# Patient Record
Sex: Female | Born: 1960 | Race: Black or African American | Hispanic: No | Smoking: Current every day smoker
Health system: Southern US, Community
[De-identification: ages and names within clinical notes are randomized; demographics above are authoritative.]

## PROBLEM LIST (undated history)

## (undated) ENCOUNTER — Emergency Department (HOSPITAL_BASED_OUTPATIENT_CLINIC_OR_DEPARTMENT_OTHER): Discharge status: Absent without leave | Payer: No Typology Code available for payment source

## (undated) DIAGNOSIS — I10 Essential (primary) hypertension: Secondary | ICD-10-CM

## (undated) DIAGNOSIS — K5900 Constipation, unspecified: Secondary | ICD-10-CM

## (undated) HISTORY — PX: HERNIA REPAIR: SHX51

---

## 2005-08-13 ENCOUNTER — Observation Stay (HOSPITAL_COMMUNITY): Admission: EM | Admit: 2005-08-13 | Discharge: 2005-08-14 | Payer: Self-pay | Admitting: Emergency Medicine

## 2008-04-23 ENCOUNTER — Emergency Department (HOSPITAL_COMMUNITY): Admission: EM | Admit: 2008-04-23 | Discharge: 2008-04-23 | Payer: Self-pay | Admitting: Emergency Medicine

## 2008-07-08 ENCOUNTER — Ambulatory Visit (HOSPITAL_COMMUNITY): Admission: RE | Admit: 2008-07-08 | Discharge: 2008-07-08 | Payer: Self-pay | Admitting: Family Medicine

## 2010-07-25 LAB — URINALYSIS, ROUTINE W REFLEX MICROSCOPIC
Bilirubin Urine: NEGATIVE
Glucose, UA: NEGATIVE mg/dL
Leukocytes, UA: NEGATIVE
Nitrite: NEGATIVE
Protein, ur: NEGATIVE mg/dL
Specific Gravity, Urine: >1.03 — ABNORMAL HIGH (ref 1.005–1.030)
Urobilinogen, UA: 0.2 mg/dL (ref 0.0–1.0)
pH: 5.5 (ref 5.0–8.0)

## 2010-07-25 LAB — URINE MICROSCOPIC-ADD ON

## 2010-07-25 LAB — PREGNANCY, URINE: Preg Test, Ur: NEGATIVE

## 2010-08-26 NOTE — H&P (Signed)
[name], [name]                ACCOUNT NO.:  [id number]   MEDICAL RECORD NO.:  [id number]          PATIENT TYPE:  INP   LOCATION:  A203                          FACILITY:  [facility]   PHYSICIAN:  Osvaldo Shipper, MD     DATE OF BIRTH:  [birth year]   DATE OF ADMISSION:  08/13/2005  DATE OF DISCHARGE:  LH                                HISTORY & PHYSICAL   The patient does not have a primary medical doctor.  She lives in  [place].   ADMITTING DIAGNOSES:  1.  Altered mental status.  2.  Polysubstance abuse including opiates and cocaine.  3.  Anemia.   CHIEF COMPLAINT:  Altered mental status.   HISTORY OF PRESENT ILLNESS:  The patient is a 50 year old [place] American  female who does not have any medical problems, who was visiting her mother  from [place] when her mother, this morning, noticed that she was acting  strangely.  She was not responding to her appropriately, although she did  not witness any seizure episode.  She did not have any syncopal episode as  per her mother, but she did slip down onto the floor.  At that point, mother  called EMS and she was brought into the hospital.  The patient is denying  any kind of pain at this time. She mentioned that she took cocaine and  hydrocodone on Friday night.  She did have some alcoholic drinks on Friday  night, but she denied doing anything yesterday.  She does not know what  happened this morning.   She denies any other complaints at this time.   MEDICATIONS:  None.   ALLERGIES:  NO KNOWN DRUG ALLERGIES.   PAST MEDICAL HISTORY:  Again, she has some dental problems and she was  diagnosed with urine infection a few weeks ago, and she was diagnosed with a  fungal infection a few weeks ago, but apart from that, no other history is  available.   SOCIAL HISTORY:  Lives in [place].  Does drugs.  Smokes cigarettes.  Alcohol occasionally.   FAMILY HISTORY:  Unknown.   REVIEW OF SYSTEMS:  Unable to do because of altered  mental status.   PHYSICAL EXAMINATION:  VITAL SIGNS:  Temperature 98.7.  Heart rate is 64.  Blood pressure is 134/79.  Saturation is 100% on room air.  GENERAL EXAM:  This is a well-developed and well-nourished individual in no  apparent distress.  HEENT:  There is no head or neck disorder.  Mucous membranes are moist.  LUNGS:  Clear to auscultation bilaterally.  CARDIOVASCULAR:  S1 and S2 normal.  Regular.  No murmurs appreciated.  ABDOMEN:  Soft, nontender, nondistended.  Bowel sounds are present.  No  masses or organomegaly appreciated.  EXTREMITIES:  Without edema.  NEURO:  Pupils are equal, reactive.  No focal deficits appreciated.   LABORATORY DATA:  White count is 5.3, hemoglobin is 9.8, MCV is 83.  Platelet count is 351.  BMP is  unremarkable.  Amylase is 69.  Acetaminophen  level is less than 10, Salicylate less than 4.  Urine tox  screen was  positive for cocaine and opiates.  Alcohol is less than 5.   No imaging studies have been done.   IMPRESSION:  This is a 50 year old African American female with no real past  medical history except polysubstance abuse, who presents with altered mental  status.  All of this is likely secondary to her polysubstance abuse.  I do  not suspect any kind of intracranial process that is causing these episodes,  although seizure disorder should be on the differential.  The patient was  initially evaluated by [facility], however, she was too drowsy to be sent over to  any behavioral health center at this time.   PLAN:  1.  Altered mental status is likely secondary to polysubstance abuse.  We      will observe the patient in the hospital for overnight observation and      then have [facility] re-evaluate her tomorrow morning.  We will obtain a CT of      the head, non-contrast.  We will check urine pregnancy test, UA with      micro, and check LFTs, do iron studies for her anemia.  Repeat CBC in      the morning to make sure that she does not have any  further drop in her      hemoglobin.   Further management decisions will be based on the initial testing and the  patient's response to treatment.      Osvaldo Shipper, MD  Electronically Signed     GK/MEDQ  D:  08/13/2005  T:  08/13/2005  Job:  [id number]

## 2010-08-26 NOTE — Discharge Summary (Signed)
[name], [name]                ACCOUNT NO.:  [id number]   MEDICAL RECORD NO.:  [id number]          PATIENT TYPE:  INP   LOCATION:  A203                          FACILITY:  [facility]   PHYSICIAN:  Renato Battles, M.D.     DATE OF BIRTH:  1960/07/10   DATE OF ADMISSION:  08/13/2005  DATE OF DISCHARGE:  05/07/2007LH                                 DISCHARGE SUMMARY   PRIMARY CARE PHYSICIAN:  Unassigned.   DISCHARGE DIAGNOSES:  1.  Altered mental status secondary to overdose.  2.  Multi-substance abuse.  3.  Anemia, most likely chronic disease.   DISCHARGE MEDICATIONS:  None.   CONSULTATIONS:  [facility] Team.   PROCEDURES:  None.   HISTORY OF PRESENT ILLNESS/HOSPITAL COURSE:  The patient is a 50 year old  African American female who was found by her friends to be unresponsive.  Was brought to the emergency department for evaluation.  She was noted to be  very drowsy and very difficult to arouse.  It was very difficult to obtain  any history from her.  When she woke up, she told me that she did some  cocaine.  She took Oxycodone that was given to her by a dentist after  treatment for dental abscess.  When she ran out, she took some narcotics  from her neighbor, but she did not know what it was and mixed it with  cocaine.  She expressed desire to quit using cocaine and I refered her to  chemical dependence team.   PHYSICAL EXAMINATION:  Not significant for any abnormal findings except from  assessment on presentation.  She as very stable for discharge.   DISCHARGE INSTRUCTIONS:  1.  Diet:  Heart healthy.  2.  Activity:  As tolerated.   DISPOSITION:  Home with polysubstance abuse assistance referral.     Renato Battles, M.D.  Electronically Signed    SA/MEDQ  D:  08/14/2005  T:  08/15/2005  Job:  [id number]

## 2011-02-22 ENCOUNTER — Encounter: Payer: Self-pay | Admitting: *Deleted

## 2011-02-22 ENCOUNTER — Emergency Department (HOSPITAL_COMMUNITY)
Admission: EM | Admit: 2011-02-22 | Discharge: 2011-02-22 | Disposition: A | Discharge status: Home / Self Care | Payer: No Typology Code available for payment source | Attending: Emergency Medicine | Admitting: Emergency Medicine

## 2011-02-22 ENCOUNTER — Emergency Department (HOSPITAL_COMMUNITY): Payer: No Typology Code available for payment source

## 2011-02-22 DIAGNOSIS — S139XXA Sprain of joints and ligaments of unspecified parts of neck, initial encounter: Secondary | ICD-10-CM | POA: Insufficient documentation

## 2011-02-22 DIAGNOSIS — S161XXA Strain of muscle, fascia and tendon at neck level, initial encounter: Secondary | ICD-10-CM

## 2011-02-22 DIAGNOSIS — I10 Essential (primary) hypertension: Secondary | ICD-10-CM | POA: Insufficient documentation

## 2011-02-22 HISTORY — DX: Essential (primary) hypertension: I10

## 2011-02-22 MED ORDER — CYCLOBENZAPRINE HCL 5 MG PO TABS: 5.0000 mg | ORAL_TABLET | Freq: Three times a day (TID) | ORAL | Status: AC | PRN
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
## 2011-02-24 NOTE — ED Provider Notes (Signed)
Medical screening examination/treatment/procedure(s) were performed by non-physician practitioner and as supervising physician I was immediately available for consultation/collaboration.   Dougles Kimmey L Baylon Santelli, MD 02/24/11 1512 

## 2017-06-03 ENCOUNTER — Emergency Department (HOSPITAL_COMMUNITY): Payer: Worker's Compensation

## 2017-06-03 ENCOUNTER — Other Ambulatory Visit: Payer: Self-pay

## 2017-06-03 ENCOUNTER — Encounter (HOSPITAL_COMMUNITY): Payer: Self-pay | Admitting: Emergency Medicine

## 2017-06-03 ENCOUNTER — Emergency Department (HOSPITAL_COMMUNITY)
Admission: EM | Admit: 2017-06-03 | Discharge: 2017-06-03 | Disposition: A | Discharge status: Home / Self Care | Payer: Worker's Compensation | Attending: Emergency Medicine | Admitting: Emergency Medicine

## 2017-06-03 DIAGNOSIS — Y990 Civilian activity done for income or pay: Secondary | ICD-10-CM | POA: Diagnosis not present

## 2017-06-03 DIAGNOSIS — Y929 Unspecified place or not applicable: Secondary | ICD-10-CM | POA: Diagnosis not present

## 2017-06-03 DIAGNOSIS — S8992XA Unspecified injury of left lower leg, initial encounter: Secondary | ICD-10-CM

## 2017-06-03 DIAGNOSIS — F17210 Nicotine dependence, cigarettes, uncomplicated: Secondary | ICD-10-CM | POA: Insufficient documentation

## 2017-06-03 DIAGNOSIS — I10 Essential (primary) hypertension: Secondary | ICD-10-CM | POA: Diagnosis not present

## 2017-06-03 DIAGNOSIS — W228XXA Striking against or struck by other objects, initial encounter: Secondary | ICD-10-CM | POA: Insufficient documentation

## 2017-06-03 DIAGNOSIS — Y9389 Activity, other specified: Secondary | ICD-10-CM | POA: Insufficient documentation

## 2017-06-03 DIAGNOSIS — Z79899 Other long term (current) drug therapy: Secondary | ICD-10-CM | POA: Insufficient documentation

## 2017-06-03 MED ORDER — MELOXICAM 15 MG PO TABS: 15.0000 mg | ORAL_TABLET | Freq: Every day | ORAL | 0 refills | Status: DC

## 2017-06-03 MED ORDER — OXYCODONE-ACETAMINOPHEN 5-325 MG PO TABS: 1.0000 | ORAL_TABLET | Freq: Four times a day (QID) | ORAL | 0 refills | Status: DC | PRN

## 2017-06-03 MED ORDER — OXYCODONE-ACETAMINOPHEN 5-325 MG PO TABS
1.0000 | ORAL_TABLET | Freq: Once | ORAL | Status: AC
  Administered 2017-06-03: 1 via ORAL
  Filled 2017-06-03: qty 1

## 2017-06-03 NOTE — Discharge Instructions (Addendum)
Your x-ray today was negative for a broken bone/fracture.   Please wear the knee sleeve to provide compression to the joint to help with pain.  Use crutches until your pain improves so that you do not have to put weight on your left foot.  When you are sitting and resting at home, probably your leg above the level of your heart to help with pain and swelling.  Apply ice as frequently as needed to help with your pain and swelling.  For mild to moderate pain, you can take 1 tablet of meloxicam daily with food.  Please do not take ibuprofen, Advil, Aleve, or naproxen while taking this medication as they are very similar medicines.  You can also take 650 mg of Tylenol every 6 hours if the meloxicam is unable to control your pain.  For severe pain, you may take 1 tablet of Percocet every 6 hours.  This medication is a narcotic and should only be used for uncontrollable pain.  Please try to stop taking this medication as soon as possible because it can be addicting.  Please do not drive or work while taking this medication because it can impair your judgment and decision-making.   If your pain does not start to improve with these activities within the next week, you can follow-up with your primary nurse practitioner or call and schedule a follow-up appointment with Dr. Aundria Rudogers, who is an orthopedist/bone doctor.  If you develop new or worsening symptoms, including numbness or weakness in the left lower leg, if the knee becomes red or hot to the touch, or if you have a new fall or injury, or other concerning symptoms, please return to the emergency department for reevaluation.

## 2017-06-03 NOTE — ED Notes (Signed)
Pt returned from xray
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
## 2017-12-20 ENCOUNTER — Ambulatory Visit (HOSPITAL_COMMUNITY)
Admission: RE | Admit: 2017-12-20 | Discharge: 2017-12-20 | Disposition: A | Discharge status: Home / Self Care | Payer: Worker's Compensation | Source: Ambulatory Visit | Attending: Orthopedic Surgery | Admitting: Orthopedic Surgery

## 2017-12-20 ENCOUNTER — Encounter (HOSPITAL_COMMUNITY): Payer: Self-pay | Admitting: Emergency Medicine

## 2017-12-20 ENCOUNTER — Other Ambulatory Visit (HOSPITAL_COMMUNITY): Payer: Self-pay | Admitting: Orthopedic Surgery

## 2017-12-20 ENCOUNTER — Emergency Department (HOSPITAL_COMMUNITY)
Admission: EM | Admit: 2017-12-20 | Discharge: 2017-12-20 | Disposition: A | Discharge status: Home / Self Care | Payer: Worker's Compensation | Attending: Emergency Medicine | Admitting: Emergency Medicine

## 2017-12-20 DIAGNOSIS — R52 Pain, unspecified: Secondary | ICD-10-CM

## 2017-12-20 DIAGNOSIS — M7989 Other specified soft tissue disorders: Secondary | ICD-10-CM | POA: Insufficient documentation

## 2017-12-20 DIAGNOSIS — M79605 Pain in left leg: Secondary | ICD-10-CM | POA: Diagnosis present

## 2017-12-20 DIAGNOSIS — Z5321 Procedure and treatment not carried out due to patient leaving prior to being seen by health care provider: Secondary | ICD-10-CM | POA: Insufficient documentation

## 2017-12-20 DIAGNOSIS — M79662 Pain in left lower leg: Secondary | ICD-10-CM | POA: Insufficient documentation

## 2017-12-20 NOTE — ED Triage Notes (Signed)
Pt co left lower leg swelling and pain x 5 days. Reports that she was at her pre-op appt today and was told to go to ED to rule out PE.

## 2017-12-20 NOTE — Progress Notes (Signed)
Left lower extremity venous duplex has been completed. Negative for DVT. An area of mixed echogenicity extending from the medial, proximal calf to the mid calf of the left lower extremity is noted. [facility]. Luiz BlareGraves' office.  12/20/17 2:03 PM Olen CordialGreg Seara Hinesley RVT

## 2018-11-01 ENCOUNTER — Other Ambulatory Visit: Payer: Self-pay

## 2018-11-01 DIAGNOSIS — Z20822 Contact with and (suspected) exposure to covid-19: Secondary | ICD-10-CM

## 2018-11-04 LAB — NOVEL CORONAVIRUS, NAA: SARS-CoV-2, NAA: NOT DETECTED

## 2020-07-11 ENCOUNTER — Encounter (HOSPITAL_COMMUNITY): Payer: Self-pay

## 2020-07-11 ENCOUNTER — Emergency Department (HOSPITAL_COMMUNITY): Payer: No Typology Code available for payment source

## 2020-07-11 ENCOUNTER — Other Ambulatory Visit: Payer: Self-pay

## 2020-07-11 ENCOUNTER — Emergency Department (HOSPITAL_COMMUNITY)
Admission: EM | Admit: 2020-07-11 | Discharge: 2020-07-11 | Disposition: A | Discharge status: Home / Self Care | Payer: No Typology Code available for payment source | Attending: Emergency Medicine | Admitting: Emergency Medicine

## 2020-07-11 DIAGNOSIS — R1310 Dysphagia, unspecified: Secondary | ICD-10-CM | POA: Diagnosis not present

## 2020-07-11 DIAGNOSIS — J029 Acute pharyngitis, unspecified: Secondary | ICD-10-CM | POA: Diagnosis present

## 2020-07-11 DIAGNOSIS — I10 Essential (primary) hypertension: Secondary | ICD-10-CM | POA: Insufficient documentation

## 2020-07-11 DIAGNOSIS — F17210 Nicotine dependence, cigarettes, uncomplicated: Secondary | ICD-10-CM | POA: Diagnosis not present

## 2020-07-11 DIAGNOSIS — Z720 Tobacco use: Secondary | ICD-10-CM

## 2020-07-11 LAB — BASIC METABOLIC PANEL
Anion gap: 8 (ref 5–15)
BUN: 15 mg/dL (ref 8–23)
CO2: 31 mmol/L (ref 22–32)
Calcium: 9.5 mg/dL (ref 8.9–10.3)
Chloride: 102 mmol/L (ref 98–111)
Creatinine, Ser: 0.62 mg/dL (ref 0.44–1.00)
GFR, Estimated: >60 mL/min (ref >60)
Glucose, Bld: 106 mg/dL — ABNORMAL HIGH (ref 70–99)
Potassium: 3.8 mmol/L (ref 3.5–5.1)
Sodium: 141 mmol/L (ref 135–145)

## 2020-07-11 LAB — CBC
HCT: 38.8 % (ref 36.0–46.0)
Hemoglobin: 12.2 g/dL (ref 12.0–15.0)
MCH: 31.2 pg (ref 26.0–34.0)
MCHC: 31.4 g/dL (ref 30.0–36.0)
MCV: 99.2 fL (ref 80.0–100.0)
Platelets: 264 10*3/uL (ref 150–400)
RBC: 3.91 MIL/uL (ref 3.87–5.11)
RDW: 13.8 % (ref 11.5–15.5)
WBC: 7.3 10*3/uL (ref 4.0–10.5)
nRBC: 0 % (ref 0.0–0.2)

## 2020-07-11 LAB — GROUP A STREP BY PCR: Group A Strep by PCR: NOT DETECTED

## 2020-07-11 MED ORDER — AMOXICILLIN 250 MG/5ML PO SUSR
500.0000 mg | Freq: Once | ORAL | Status: AC
  Administered 2020-07-11: 500 mg via ORAL
  Filled 2020-07-11: qty 10

## 2020-07-11 MED ORDER — LISINOPRIL 10 MG PO TABS
20.0000 mg | ORAL_TABLET | ORAL | Status: AC
  Administered 2020-07-11: 20 mg via ORAL
  Filled 2020-07-11: qty 2

## 2020-07-11 MED ORDER — LIDOCAINE VISCOUS HCL 2 % MT SOLN
15.0000 mL | Freq: Once | OROMUCOSAL | Status: AC
  Administered 2020-07-11: 15 mL via OROMUCOSAL
  Filled 2020-07-11: qty 15

## 2020-07-11 MED ORDER — DEXAMETHASONE SODIUM PHOSPHATE 10 MG/ML IJ SOLN
10.0000 mg | Freq: Once | INTRAMUSCULAR | Status: AC
  Administered 2020-07-11: 10 mg via INTRAVENOUS
  Filled 2020-07-11: qty 1

## 2020-07-11 MED ORDER — AMOXICILLIN 500 MG PO CAPS: 500.0000 mg | ORAL_CAPSULE | Freq: Three times a day (TID) | ORAL | 0 refills | Status: DC

## 2020-07-11 MED ORDER — IOHEXOL 300 MG/ML  SOLN
75.0000 mL | Freq: Once | INTRAMUSCULAR | Status: AC | PRN
  Administered 2020-07-11: 75 mL via INTRAVENOUS

## 2020-07-11 NOTE — ED Notes (Signed)
Dr Hyacinth Meeker aware of HRs in 40s.

## 2020-07-11 NOTE — Discharge Instructions (Addendum)
Your testing shows some inflammation of your throat -   You should see your doctor in 2 days for recheck  Amoxicillin 3 times daily for 10 days  ER for worsening symtpoms  Lots of fluids  Tylenol or ibuprofen for pain  [facility] Doctor List    Kari Baars MD. Specialty: Pulmonary Disease Contact information: [address]  [address]  [place] [address]  [phone]   Syliva Overman, MD. Specialty: [facility] Contact information: [address]  [place] [address]  [phone]   Lilyan Punt, MD. Specialty: Family Medicine Contact information: [address]  [place] [address]  [phone]   Avon Gully, MD Specialty: Internal Medicine Contact information: [address] [place] [address]  [phone]   Catalina Pizza, MD. Specialty: Internal Medicine Contact information: [address]  [place] [address]  [phone]    [facility] (Dr. Selena Batten) Specialty: Family Medicine Contact information: [address]  [place] [address]  [phone]   John Giovanni, MD. Specialty: [facility] Contact information: [address]  [address]  [place] [address]  [phone]   Carylon Perches, MD. Specialty: Internal Medicine Contact information: [address]  [address]  [place] [address]  [phone]    [facility]  [address] [place], [place] [address] [phone]  Services [facility] offers a variety of basic health services.  Services include but are not limited to: Blood pressure checks  Heart rate checks  Blood sugar checks  Urine analysis  Rapid strep tests  Pregnancy tests.  Health education and referrals  People needing more complex services will be directed to a physician online. Using these virtual visits, doctors can evaluate and prescribe medicine and  treatments. There will be no medication on-site, though [facility] will help patients fill their prescriptions at little to no cost.   For More information please go to: [email or web address]

## 2020-07-11 NOTE — Clinical Social Work Note (Signed)
[facility] notification done [phone]

## 2020-07-11 NOTE — ED Provider Notes (Signed)
[facility] EMERGENCY DEPARTMENT Provider Note   CSN: [id number] Arrival date & time: 07/11/20  [id number]     History Chief Complaint  Patient presents with  . Sore Throat    [name] is a 60 y.o. female.  HPI   This patient is a 60 year old female with a history of hypertension, she also reports a very heavy tobacco use which she has been doing for a very long time.  She reports over the last week that she has had a sore throat which is her chief complaint, this is been persistent, it does not get better with any throat lozenges, she states it is hard to swallow in fact she feels like she is going to aspirate when she does try to swallow so she has not taken her blood pressure medication in at least 1 week but even prior to that she endorses not being compliant with her medicines.  She does not have any fevers or chills, she has had a bit of a cough, this is an unusual cough for her.  She states that she has been to the [facility] in the past because of difficulty with swallowing and sore throats and they have not been able to diagnose her with anything.  She is unsure exactly what test they did but when I talk to her about a possible scope by ENT she describes that they did run a camera down her throat but did not find anything.  Her symptoms have worsened this week, she has not had nausea or vomiting, she has not had diarrhea, she has no abdominal pain, no rash, no swelling, no headache.  No changes in vision.  No improvement with throat lozenges.  No sick contacts.  No recent travel.  Past Medical History:  Diagnosis Date  . Hypertension     There are no problems to display for this patient.   Past Surgical History:  Procedure Laterality Date  . HERNIA REPAIR       OB History   No obstetric history on file.     No family history on file.  Social History   Tobacco Use  . Smoking status: Current Every Day Smoker    Packs/day: 1.00    Types: Cigarettes  .  Smokeless tobacco: Never Used  Vaping Use  . Vaping Use: Never used  Substance Use Topics  . Alcohol use: No  . Drug use: Not Currently    Home Medications Prior to Admission medications   Medication Sig Start Date End Date Taking? Authorizing Provider  amoxicillin (AMOXIL) 500 MG capsule Take 1 capsule (500 mg total) by mouth 3 (three) times daily. 07/11/20  Yes Eber HongMiller, Ebonique Hallstrom, MD  docusate sodium (COLACE) 100 MG capsule Take 100 mg by mouth 2 (two) times daily.   Yes [provider]  LISINOPRIL PO Take 1 tablet by mouth daily.   Yes [provider]    Allergies    Patient has no known allergies.  Review of Systems   Review of Systems  All other systems reviewed and are negative.   Physical Exam Updated Vital Signs BP (!) 150/63   Pulse (!) 45   Temp 99.1 F (37.3 C) (Oral)   Resp 14   Ht 1.651 m (5\' 5" )   Wt 74.8 kg   SpO2 92%   BMI 27.46 kg/m   Physical Exam Vitals and nursing note reviewed.  Constitutional:      General: She is not in acute distress.  Appearance: She is well-developed.  HENT:     Head: Normocephalic and atraumatic.     Comments: The patient has a very gravelly voice    Nose: No congestion or rhinorrhea.     Mouth/Throat:     Pharynx: No oropharyngeal exudate.     Comments: The oropharynx is clear and moist, there is no signs of uvular deviation or swelling or edema, the tonsillar pillars are well visualized, there is no trismus or torticollis, there is no exudate Eyes:     General: No scleral icterus.       Right eye: No discharge.        Left eye: No discharge.     Conjunctiva/sclera: Conjunctivae normal.     Pupils: Pupils are equal, round, and reactive to light.  Neck:     Thyroid: No thyromegaly.     Vascular: No JVD.     Comments: Neck exam is unremarkable Cardiovascular:     Rate and Rhythm: Normal rate and regular rhythm.     Heart sounds: Normal heart sounds. No murmur heard. No friction rub. No gallop.    Pulmonary:     Effort: Pulmonary effort is normal. No respiratory distress.     Breath sounds: Normal breath sounds. No wheezing or rales.  Abdominal:     General: Bowel sounds are normal. There is no distension.     Palpations: Abdomen is soft. There is no mass.     Tenderness: There is no abdominal tenderness.  Musculoskeletal:        General: No tenderness. Normal range of motion.     Cervical back: Normal range of motion and neck supple.  Lymphadenopathy:     Cervical: No cervical adenopathy.  Skin:    General: Skin is warm and dry.     Findings: No erythema or rash.  Neurological:     Mental Status: She is alert.     Coordination: Coordination normal.  Psychiatric:        Behavior: Behavior normal.     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 Query throat mass. Sore throat x1 week. EXAM: CT NECK WITH CONTRAST TECHNIQUE: Multidetector CT imaging of the neck was performed using the standard protocol following the bolus administration of intravenous contrast. CONTRAST:  21mL OMNIPAQUE IOHEXOL 300 MG/ML  SOLN COMPARISON:  Head CT 08/13/2005. FINDINGS: Pharynx and larynx: The glottis is closed. Above and below the glottis laryngeal soft tissue contours appear within normal limits; mild generalized thickening of the epiglottis suspected. There also seems to be mild generalized pharyngeal mucosal space soft tissue thickening. Mild symmetric lingual tonsil hypertrophy. But no discrete pharyngeal mass or hyperenhancement. The parapharyngeal and  retropharyngeal spaces appear negative. Oral tongue appears symmetric. Salivary glands: Negative sublingual space. Symmetric appearing submandibular gland ductal ectasia. No sialolithiasis or acute gland inflammation. Bilateral parotid glands appear symmetric and normal. Thyroid: Negative. Lymph nodes: Symmetric appearing bilateral cervical lymph nodes, individually measuring up to 6-7 mm short axis. No suspicious lymph nodes identified. Vascular: Major  vascular structures in the neck and at the skull base appear to be patent. Tortuous right carotid. Bilateral cervical carotid soft and calcified atherosclerosis. No abnormality of either jugular foramen. Limited intracranial: Negative. Visualized orbits: Negative. Mastoids and visualized paranasal sinuses: Small mucous retention cysts in the maxillary sinuses, otherwise well aerated. Visible tympanic cavities and mastoids are clear. Skeleton: Absent dentition. Visible osseous structures appear normal for age. Upper chest: Apical lung scarring and centrilobular emphysema. No upper lung nodule. Minor calcified aortic atherosclerosis. No superior mediastinal lymphadenopathy. Other: None. IMPRESSION: 1. Mild generalized pharyngeal mucosal space soft tissue thickening, such can be due to URI. But negative for discrete pharyngeal or laryngeal mass. Correlation with endoscopic evaluation by ENT would be complementary. 2. 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 3. Emphysema (ICD10-J43.9). Electronically Signed   By: Odessa Fleming M.D.   On: 07/11/2020 11:52   DG Chest Port 1 View  Result Date: 07/11/2020 CLINICAL DATA:  Cough, smoker EXAM: PORTABLE CHEST 1 VIEW COMPARISON:  None. FINDINGS: Normal mediastinum and cardiac silhouette. Chronic central bronchitic markings. Normal pulmonary vasculature. No effusion, infiltrate, or pneumothorax. IMPRESSION: Mild bronchitic markings.  No acute findings. Electronically  Signed   By: Genevive Bi M.D.   On: 07/11/2020 10:14    Procedures Procedures   Medications Ordered in ED Medications  lisinopril (ZESTRIL) tablet 20 mg (20 mg Oral Given 07/11/20 0950)  lidocaine (XYLOCAINE) 2 % viscous mouth solution 15 mL (15 mLs Mouth/Throat Given 07/11/20 0950)  iohexol (OMNIPAQUE) 300 MG/ML solution 75 mL (75 mLs Intravenous Contrast Given 07/11/20 1109)  dexamethasone (DECADRON) injection 10 mg (10 mg Intravenous Given 07/11/20 1222)  amoxicillin (AMOXIL) 250 MG/5ML suspension 500 mg (500 mg Oral Given 07/11/20 1222)    ED Course  I have reviewed the triage vital signs and the nursing notes.  Pertinent labs & imaging results that were available during my care of the patient were reviewed by me and considered in my medical decision making (see chart for details).    MDM Rules/Calculators/A&P                          The patient has not recently had exam, she has a longtime smoking history, and is appropriately concerned for throat cancer.  Given the significant change in her voice and the aspiration that she feels like she is having I will check a CT scan of the neck.  Check for strep, labs, patient agreeable.  She will also be given her antihypertensives.  CT reassuring Labs reassuring VS have improved  Vitals:   07/11/20 1030 07/11/20 1100 07/11/20 1130 07/11/20 1200  BP: (!) 198/93 (!) 170/71 (!) 171/68 (!) 150/63  Pulse: (!) 46 (!) 44 (!) 49 (!) 45  Resp: 11 12 12 14   Temp:      TempSrc:      SpO2: 96% 95% 100% 92%  Weight:      Height:        Given decadron, amox, tolerated well, feels good for /dc No signs of airway compromise  Agreeable to f/u.  Final Clinical Impression(s) / ED Diagnoses Final diagnoses:  Dysphagia, unspecified type  Essential hypertension  Tobacco abuse  Pharyngitis, unspecified etiology    Rx / DC Orders ED Discharge Orders         Ordered    amoxicillin (AMOXIL) 500 MG capsule  3 times daily        07/11/20  1249            Eber Hong, MD 07/11/20 1251

## 2020-07-11 NOTE — ED Notes (Signed)
Dr Miller at bedside for MSE prior to triage complete.  

## 2020-07-11 NOTE — ED Triage Notes (Signed)
Pt presents to ED, states she has had a sore throat x 1 week, difficulty swallowing, red spots on throat. Dr Hyacinth Meeker at bedside.

## 2020-09-18 ENCOUNTER — Emergency Department (HOSPITAL_COMMUNITY)
Admission: EM | Admit: 2020-09-18 | Discharge: 2020-09-18 | Disposition: A | Discharge status: Home / Self Care | Payer: No Typology Code available for payment source | Attending: Emergency Medicine | Admitting: Emergency Medicine

## 2020-09-18 ENCOUNTER — Encounter (HOSPITAL_COMMUNITY): Payer: Self-pay

## 2020-09-18 ENCOUNTER — Other Ambulatory Visit: Payer: Self-pay

## 2020-09-18 DIAGNOSIS — E860 Dehydration: Secondary | ICD-10-CM | POA: Diagnosis not present

## 2020-09-18 DIAGNOSIS — N289 Disorder of kidney and ureter, unspecified: Secondary | ICD-10-CM

## 2020-09-18 DIAGNOSIS — Z79899 Other long term (current) drug therapy: Secondary | ICD-10-CM | POA: Insufficient documentation

## 2020-09-18 DIAGNOSIS — F17210 Nicotine dependence, cigarettes, uncomplicated: Secondary | ICD-10-CM | POA: Diagnosis not present

## 2020-09-18 DIAGNOSIS — I10 Essential (primary) hypertension: Secondary | ICD-10-CM | POA: Insufficient documentation

## 2020-09-18 DIAGNOSIS — R42 Dizziness and giddiness: Secondary | ICD-10-CM | POA: Diagnosis present

## 2020-09-18 LAB — CBC WITH DIFFERENTIAL/PLATELET
Abs Immature Granulocytes: 0.04 10*3/uL (ref 0.00–0.07)
Basophils Absolute: 0 10*3/uL (ref 0.0–0.1)
Basophils Relative: 0 %
Eosinophils Absolute: 0 10*3/uL (ref 0.0–0.5)
Eosinophils Relative: 0 %
HCT: 40.3 % (ref 36.0–46.0)
Hemoglobin: 12.7 g/dL (ref 12.0–15.0)
Immature Granulocytes: 1 %
Lymphocytes Relative: 45 %
Lymphs Abs: 3.6 10*3/uL (ref 0.7–4.0)
MCH: 31.5 pg (ref 26.0–34.0)
MCHC: 31.5 g/dL (ref 30.0–36.0)
MCV: 100 fL (ref 80.0–100.0)
Monocytes Absolute: 0.4 10*3/uL (ref 0.1–1.0)
Monocytes Relative: 5 %
Neutro Abs: 3.9 10*3/uL (ref 1.7–7.7)
Neutrophils Relative %: 49 %
Platelets: 252 10*3/uL (ref 150–400)
RBC: 4.03 MIL/uL (ref 3.87–5.11)
RDW: 12.7 % (ref 11.5–15.5)
WBC: 8 10*3/uL (ref 4.0–10.5)
nRBC: 0 % (ref 0.0–0.2)

## 2020-09-18 LAB — COMPREHENSIVE METABOLIC PANEL
ALT: 11 U/L (ref 0–44)
AST: 14 U/L — ABNORMAL LOW (ref 15–41)
Albumin: 4.3 g/dL (ref 3.5–5.0)
Alkaline Phosphatase: 74 U/L (ref 38–126)
Anion gap: 8 (ref 5–15)
BUN: 32 mg/dL — ABNORMAL HIGH (ref 8–23)
CO2: 22 mmol/L (ref 22–32)
Calcium: 9.2 mg/dL (ref 8.9–10.3)
Chloride: 105 mmol/L (ref 98–111)
Creatinine, Ser: 1.7 mg/dL — ABNORMAL HIGH (ref 0.44–1.00)
GFR, Estimated: 34 mL/min — ABNORMAL LOW (ref >60)
Glucose, Bld: 108 mg/dL — ABNORMAL HIGH (ref 70–99)
Potassium: 4 mmol/L (ref 3.5–5.1)
Sodium: 135 mmol/L (ref 135–145)
Total Bilirubin: 0.4 mg/dL (ref 0.3–1.2)
Total Protein: 7.1 g/dL (ref 6.5–8.1)

## 2020-09-18 LAB — URINALYSIS, ROUTINE W REFLEX MICROSCOPIC
Bilirubin Urine: NEGATIVE
Glucose, UA: NEGATIVE mg/dL
Ketones, ur: NEGATIVE mg/dL
Leukocytes,Ua: NEGATIVE
Nitrite: NEGATIVE
Protein, ur: NEGATIVE mg/dL
Specific Gravity, Urine: 1.018 (ref 1.005–1.030)
pH: 5 (ref 5.0–8.0)

## 2020-09-18 MED ORDER — SODIUM CHLORIDE 0.9 % IV BOLUS
1000.0000 mL | Freq: Once | INTRAVENOUS | Status: AC
  Administered 2020-09-18: 1000 mL via INTRAVENOUS

## 2020-09-18 NOTE — ED Provider Notes (Signed)
[facility] EMERGENCY DEPARTMENT Provider Note   CSN: [id number] Arrival date & time: 09/18/20  1849     History Chief Complaint  Patient presents with   Hypotension    [name] is a 60 y.o. female.  HPI  This patient is a very pleasant 60 year old female, she has a history of hypertension as well as her hernia repair.  She takes lisinopril.  The patient reports to me that 5 days ago she underwent a surgical procedure to remove a polyp on her vocal cords and since that time has been taking lots of clear liquids, she has been taking oxycodone regularly for pain and today has noticed that she has been lightheaded most of the day.  She has had to take laxatives and a enema to have a bowel movement which she last had 4 days ago.  She has not had any difficulty urinating but today was feeling lightheaded, she did not pass out and had no chest pain shortness of breath coughing or fever, she has had no swelling of the legs or rashes, her father took her blood pressure at home and it measured 60 systolic and when they called 322 they told them to come to the hospital.  The patient was brought by paramedics with an IV in place, she states that her symptoms completely resolved before she left the house.  On arrival her blood pressure is 106 systolic.  She has been taking oxycodone regularly for the last 5 days, today she took it on an empty stomach and states that she felt worse after she took  Past Medical History:  Diagnosis Date   Hypertension     There are no problems to display for this patient.   Past Surgical History:  Procedure Laterality Date   HERNIA REPAIR       OB History   No obstetric history on file.     History reviewed. No pertinent family history.  Social History   Tobacco Use   Smoking status: Every Day    Packs/day: 1.00    Pack years: 0.00    Types: Cigarettes   Smokeless tobacco: Never  Vaping Use   Vaping Use: Never used  Substance Use Topics    Alcohol use: No   Drug use: Not Currently    Home Medications Prior to Admission medications   Medication Sig Start Date End Date Taking? Authorizing Provider  amoxicillin (AMOXIL) 500 MG capsule Take 1 capsule (500 mg total) by mouth 3 (three) times daily. 07/11/20   Eber Hong, MD  docusate sodium (COLACE) 100 MG capsule Take 100 mg by mouth 2 (two) times daily.    [provider]  LISINOPRIL PO Take 1 tablet by mouth daily.    [provider]    Allergies    Patient has no known allergies.  Review of Systems   Review of Systems  All other systems reviewed and are negative.  Physical Exam Updated Vital Signs BP 116/65 (BP Location: Left Arm)   Pulse (!) 54   Temp 98 F (36.7 C) (Oral)   Resp 14   Ht 1.651 m (5\' 5" )   Wt 79.8 kg   SpO2 94%   BMI 29.29 kg/m   Physical Exam Vitals and nursing note reviewed.  Constitutional:      General: She is not in acute distress.    Appearance: She is well-developed.  HENT:     Head: Normocephalic and atraumatic.     Mouth/Throat:  Pharynx: No oropharyngeal exudate.  Eyes:     General: No scleral icterus.       Right eye: No discharge.        Left eye: No discharge.     Conjunctiva/sclera: Conjunctivae normal.     Pupils: Pupils are equal, round, and reactive to light.  Neck:     Thyroid: No thyromegaly.     Vascular: No JVD.  Cardiovascular:     Rate and Rhythm: Normal rate and regular rhythm.     Heart sounds: Normal heart sounds. No murmur heard.   No friction rub. No gallop.  Pulmonary:     Effort: Pulmonary effort is normal. No respiratory distress.     Breath sounds: Normal breath sounds. No wheezing or rales.  Abdominal:     General: Bowel sounds are normal. There is no distension.     Palpations: Abdomen is soft. There is no mass.     Tenderness: There is no abdominal tenderness.  Musculoskeletal:        General: No tenderness. Normal range of motion.     Cervical back: Normal range of  motion and neck supple.  Lymphadenopathy:     Cervical: No cervical adenopathy.  Skin:    General: Skin is warm and dry.     Findings: No erythema or rash.  Neurological:     Mental Status: She is alert.     Coordination: Coordination normal.     Comments: Speech is clear, cranial nerves III through XII are intact, memory is intact, strength is normal in all 4 extremities including grips, sensation is intact to light touch and pinprick in all 4 extremities. Coordination as tested by finger-nose-finger is normal, no limb ataxia.   Psychiatric:        Behavior: Behavior normal.    ED Results / Procedures / Treatments   Labs (all labs ordered are listed, but only abnormal results are displayed) Labs Reviewed  COMPREHENSIVE METABOLIC PANEL - Abnormal; Notable for the following components:      Result Value   Glucose, Bld 108 (*)    BUN 32 (*)    Creatinine, Ser 1.70 (*)    AST 14 (*)    GFR, Estimated 34 (*)    All other components within normal limits  URINALYSIS, ROUTINE W REFLEX MICROSCOPIC - Abnormal; Notable for the following components:   APPearance CLOUDY (*)    Hgb urine dipstick SMALL (*)    Bacteria, UA RARE (*)    Non Squamous Epithelial 0-5 (*)    All other components within normal limits  URINE CULTURE  CBC WITH DIFFERENTIAL/PLATELET    EKG EKG Interpretation  Date/Time:  Saturday September 18 2020 19:14:14 EDT Ventricular Rate:  58 PR Interval:  148 QRS Duration: 106 QT Interval:  418 QTC Calculation: 411 R Axis:   8 Text Interpretation: Sinus rhythm Left atrial enlargement equivocal axis, Nonspecific T wave abnormality No old tracing to compare Confirmed by Eber Hong ([id number]) on 09/18/2020 8:20:12 PM  Radiology No results found.  Procedures Procedures this patient is   Medications Ordered in ED Medications  sodium chloride 0.9 % bolus 1,000 mL (1,000 mLs Intravenous New Bag/Given 09/18/20 1928)    ED Course  I have reviewed the triage vital signs  and the nursing notes.  Pertinent labs & imaging results that were available during my care of the patient were reviewed by me and considered in my medical decision making (see chart for details).  Clinical Course as of  09/18/20 2127  Sat Sep 18, 2020  2042 The patient is dehydrated with an elevated BUN and creatinine of 1.7, CBC is normal.  Vital signs have improved significantly after IV fluids and current blood pressure is 143/96, awaiting urinalysis, IV fluids being given [BM]    Clinical Course User Index [BM] Eber Hong, MD   MDM Rules/Calculators/A&P                          The patient's exam is actually unremarkable, thankfully her blood pressure is 106/54 and her heart rate is around 60.  I will check an EKG and some labs however I suspect that this is multifactorial in part because she has not been getting enough calories and in part because she has been taking opiate medications today without enough food on her stomach.  The patient is agreeable to the plan, she is not hypotensive, she is symptom-free subjectively and has no findings on physical exam to suggest a neurologic component  Pt appears well after fluids UCx sent -  Stable for d/c.  Final Clinical Impression(s) / ED Diagnoses Final diagnoses:  Dehydration  Renal insufficiency    Rx / DC Orders ED Discharge Orders     None        Eber Hong, MD 09/18/20 2127

## 2020-09-18 NOTE — ED Triage Notes (Signed)
Patient arrives from home, c/o hypotension, stated she took an oxycodone and think it may have contributed.

## 2020-09-18 NOTE — Discharge Instructions (Addendum)
Your testing shows that you have been dehydrated We have given you some IV fluids to help Please drink plenty of clear liquids at home, make sure that your urine is clear like water  I would like for you to have your blood work rechecked within 1 week to make sure that your kidneys are improving  If you should develop severe or worsening symptoms return to the emergency department immediately

## 2020-09-20 LAB — URINE CULTURE

## 2021-10-05 IMAGING — CT CT NECK W/ CM
3 of 4 series · 12 of 33 positions shown, 14 images · IV contrast (omnipaque)
Comparison: Head CT 08/13/2005.

CLINICAL DATA: 59-year-old female with difficulty swallowing, 9th
cranial neuropathy. Query throat mass. Sore throat x1 week.

EXAM:
CT NECK WITH CONTRAST
TECHNIQUE: Multidetector CT imaging of the neck was performed using the
standard protocol following the bolus administration of intravenous
contrast.
CONTRAST:  75mL OMNIPAQUE IOHEXOL 300 MG/ML  SOLN

[Series 6: cor neck · coronal · 0.36mm/px · 3 of 107 slices shown]
[im 22/107  bone]
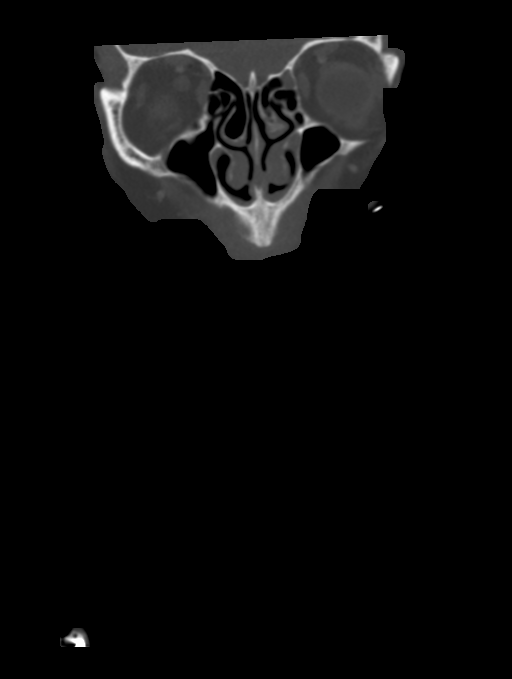
[im 43/107  bone]
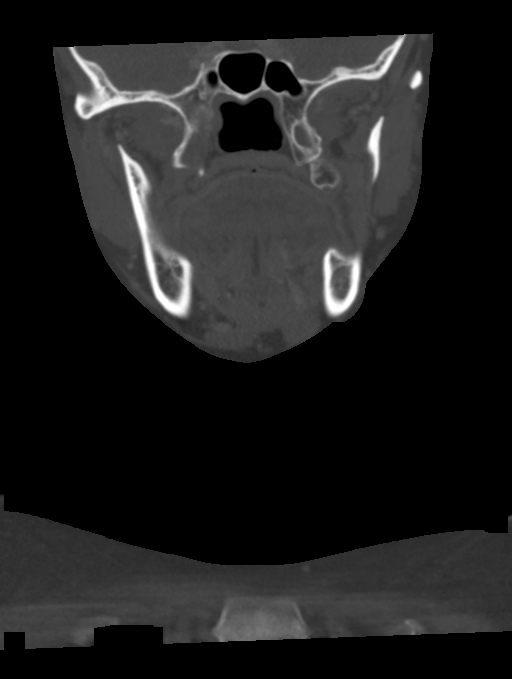
[im 64/107  bone]
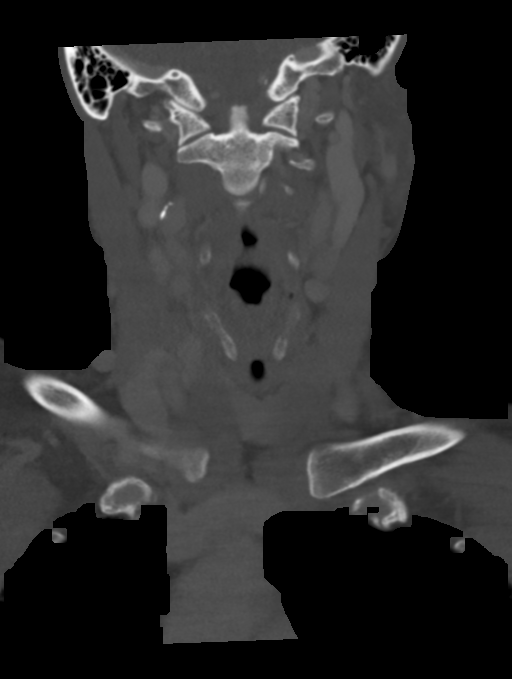

[Series 7: sag neck · sagittal · 0.48mm/px · 5 of 101 slices shown, 6 images]
[im 34/101  bone]
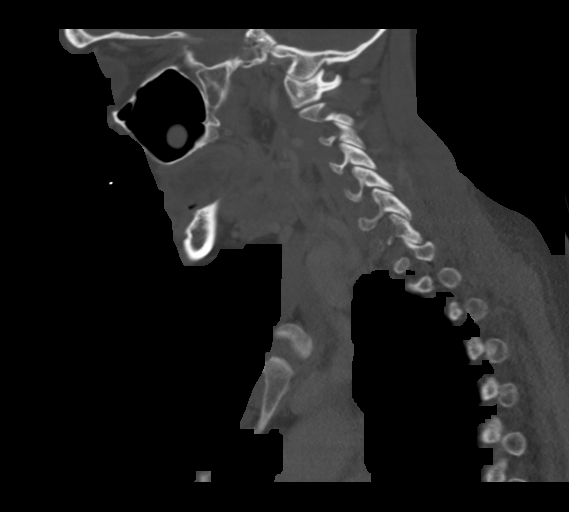
[im 42/101  bone]
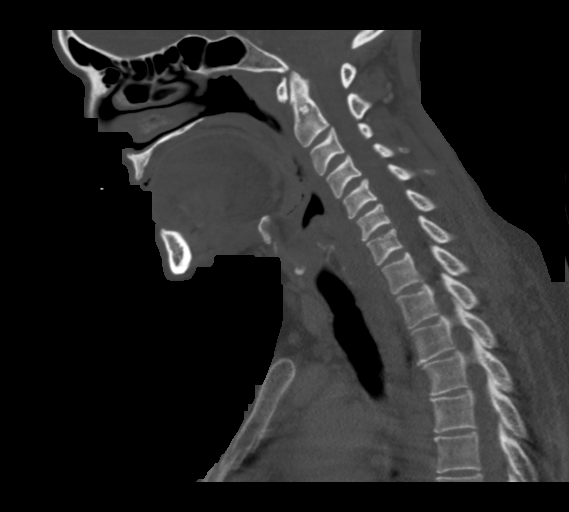
[im 51/101  soft-tissue]
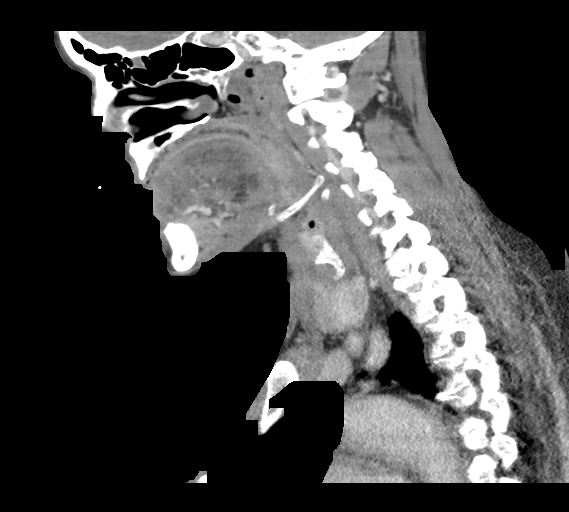
[im 51/101  bone]
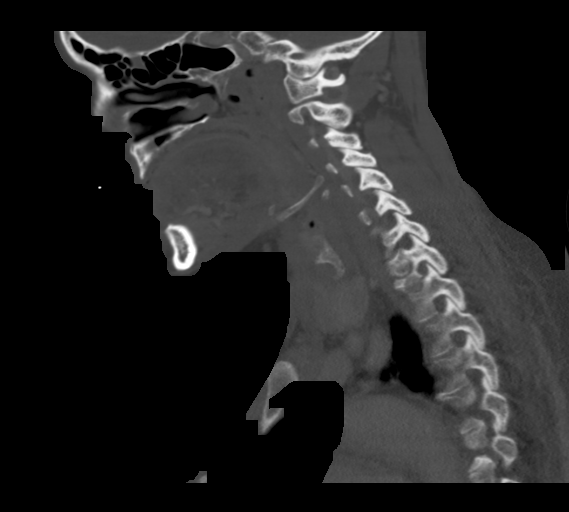
[im 59/101  bone]
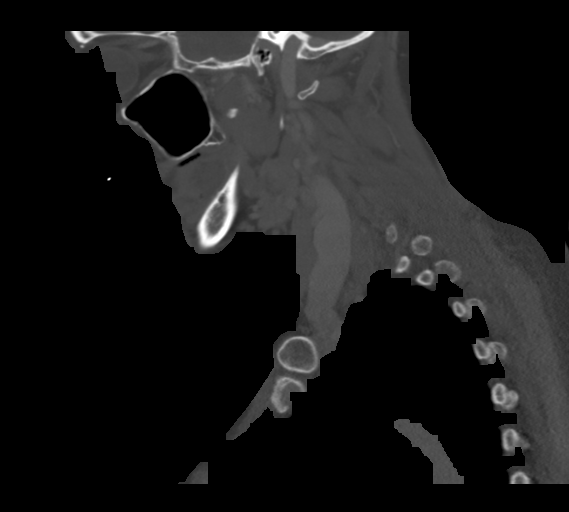
[im 67/101  bone]
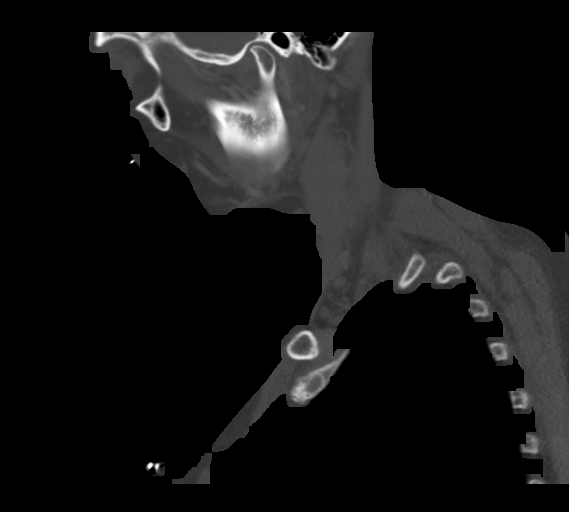

[Series 8: ax oropharynx · axial · 0.39mm/px · z∈[-284,-78]mm · 4 of 153 slices shown, 5 images]
[im 22/153  soft-tissue]
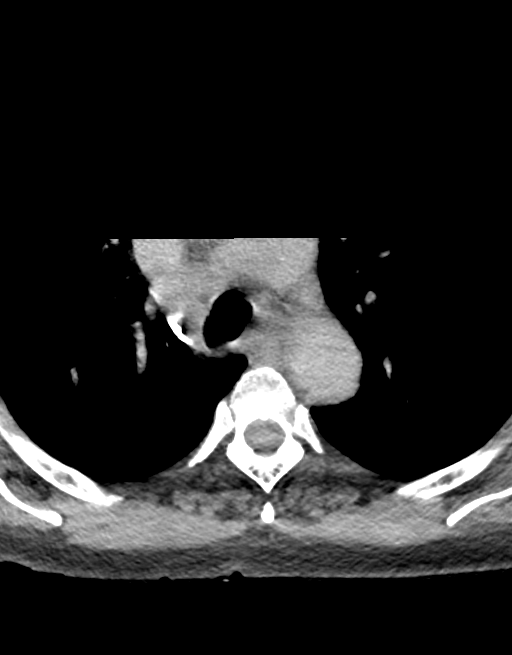
[im 22/153  bone]
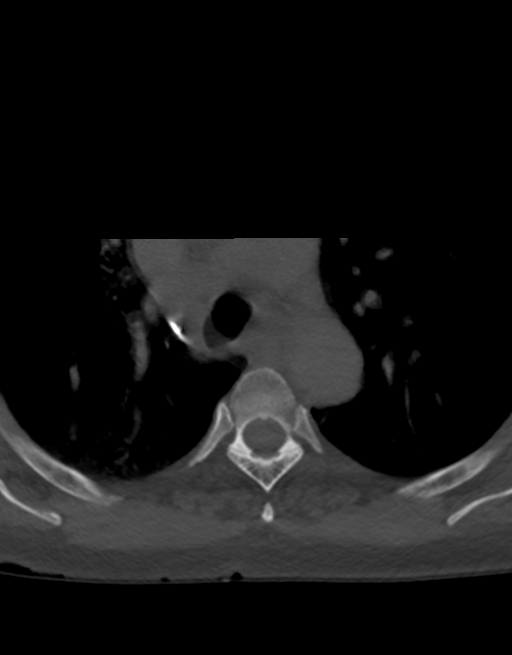
[im 66/153  bone]
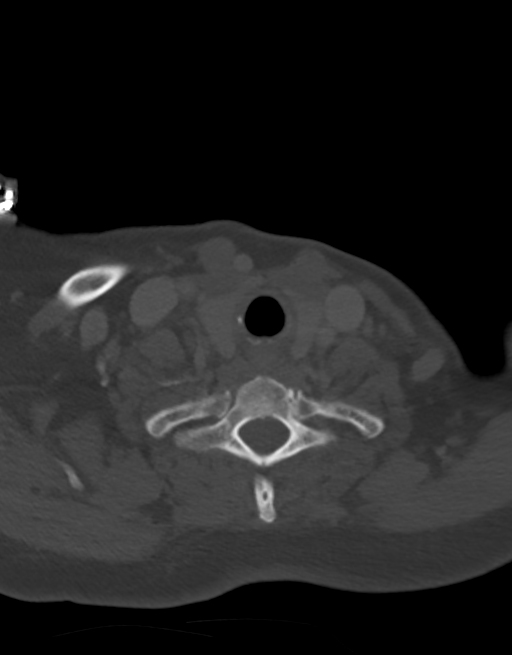
[im 87/153  bone]
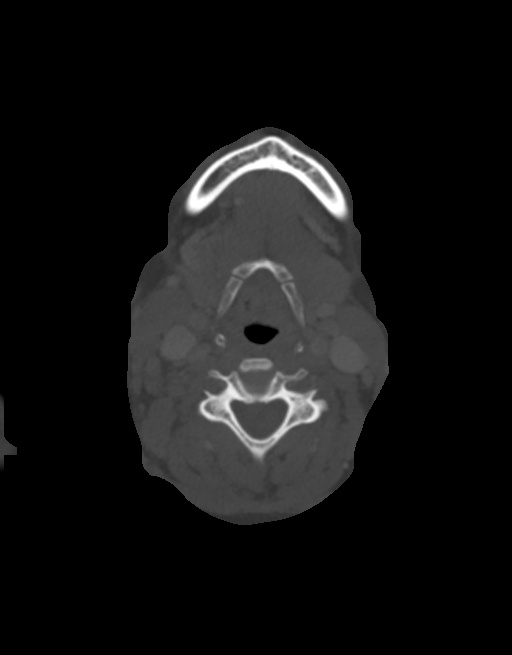
[im 131/153  bone]
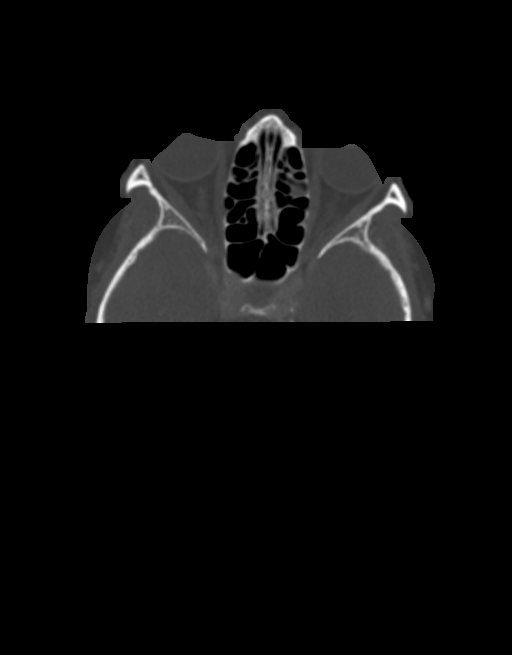

[12 of 33 positions shown; findings below may reference images not displayed]

FINDINGS: Pharynx and larynx: The glottis is closed. Above and below the
glottis laryngeal soft tissue contours appear within normal limits;
mild generalized thickening of the epiglottis suspected.

There also seems to be mild generalized pharyngeal mucosal space
soft tissue thickening. Mild symmetric lingual tonsil hypertrophy.
But no discrete pharyngeal mass or hyperenhancement.

The parapharyngeal and retropharyngeal spaces appear negative. Oral
tongue appears symmetric.

Salivary glands: Negative sublingual space. Symmetric appearing
submandibular gland ductal ectasia. No sialolithiasis or acute gland
inflammation. Bilateral parotid glands appear symmetric and normal.

Thyroid: Negative.

Lymph nodes: Symmetric appearing bilateral cervical lymph nodes,
individually measuring up to 6-7 mm short axis. No suspicious lymph
nodes identified.

Vascular: Major vascular structures in the neck and at the skull
base appear to be patent. Tortuous right carotid. Bilateral cervical
carotid soft and calcified atherosclerosis.

No abnormality of either jugular foramen.

Limited intracranial: Negative.

Visualized orbits: Negative.

Mastoids and visualized paranasal sinuses: Small mucous retention
cysts in the maxillary sinuses, otherwise well aerated. Visible
tympanic cavities and mastoids are clear.

Skeleton: Absent dentition. Visible osseous structures appear normal
for age.

Upper chest: Apical lung scarring and centrilobular emphysema. No
upper lung nodule. Minor calcified aortic atherosclerosis. No
superior mediastinal lymphadenopathy.

Other: None.
IMPRESSION: 1. Mild generalized pharyngeal mucosal space soft tissue thickening,
such can be due to URI. But negative for discrete pharyngeal or
laryngeal mass. Correlation with endoscopic evaluation by ENT would
be complementary.

2. No cervical lymphadenopathy, other neck mass, or explanation for
9th cranial neuropathy is identified.

3. Emphysema ([id number]).

## 2021-12-05 ENCOUNTER — Emergency Department (HOSPITAL_COMMUNITY)
Admission: EM | Admit: 2021-12-05 | Discharge: 2021-12-05 | Disposition: A | Discharge status: Home / Self Care | Payer: No Typology Code available for payment source | Attending: Emergency Medicine | Admitting: Emergency Medicine

## 2021-12-05 ENCOUNTER — Emergency Department (HOSPITAL_COMMUNITY): Payer: No Typology Code available for payment source

## 2021-12-05 ENCOUNTER — Other Ambulatory Visit: Payer: Self-pay

## 2021-12-05 ENCOUNTER — Encounter (HOSPITAL_COMMUNITY): Payer: Self-pay | Admitting: Emergency Medicine

## 2021-12-05 DIAGNOSIS — S139XXA Sprain of joints and ligaments of unspecified parts of neck, initial encounter: Secondary | ICD-10-CM | POA: Diagnosis not present

## 2021-12-05 DIAGNOSIS — Z79899 Other long term (current) drug therapy: Secondary | ICD-10-CM | POA: Insufficient documentation

## 2021-12-05 DIAGNOSIS — W010XXA Fall on same level from slipping, tripping and stumbling without subsequent striking against object, initial encounter: Secondary | ICD-10-CM

## 2021-12-05 DIAGNOSIS — I10 Essential (primary) hypertension: Secondary | ICD-10-CM | POA: Diagnosis not present

## 2021-12-05 DIAGNOSIS — R001 Bradycardia, unspecified: Secondary | ICD-10-CM | POA: Diagnosis not present

## 2021-12-05 DIAGNOSIS — W01198A Fall on same level from slipping, tripping and stumbling with subsequent striking against other object, initial encounter: Secondary | ICD-10-CM | POA: Diagnosis not present

## 2021-12-05 DIAGNOSIS — S0990XA Unspecified injury of head, initial encounter: Secondary | ICD-10-CM | POA: Diagnosis present

## 2021-12-05 DIAGNOSIS — S060X1A Concussion with loss of consciousness of 30 minutes or less, initial encounter: Secondary | ICD-10-CM | POA: Diagnosis not present

## 2021-12-05 MED ORDER — ACETAMINOPHEN 325 MG PO TABS
650.0000 mg | ORAL_TABLET | Freq: Once | ORAL | Status: AC
  Administered 2021-12-05: 650 mg via ORAL
  Filled 2021-12-05: qty 2

## 2021-12-05 MED ORDER — IBUPROFEN 600 MG PO TABS: 600.0000 mg | ORAL_TABLET | Freq: Four times a day (QID) | ORAL | 0 refills | Status: AC | PRN

## 2021-12-05 MED ORDER — IBUPROFEN 400 MG PO TABS
400.0000 mg | ORAL_TABLET | Freq: Once | ORAL | Status: AC
  Administered 2021-12-05: 400 mg via ORAL
  Filled 2021-12-05: qty 1

## 2021-12-05 NOTE — ED Triage Notes (Signed)
Pt fell last Saturday (got tripped up by dog chains) and hit the back of her head on bricks knocking herself out. Pt seeking evaluation today d/t continued soreness on back of her head that radiates down both sides of her neck.

## 2021-12-05 NOTE — ED Provider Notes (Signed)
[facility] EMERGENCY DEPARTMENT Provider Note   CSN: [id number] Arrival date & time: 12/05/21  0008     History  Chief Complaint  Patient presents with   Marletta Lor    [name] is a 61 y.o. female.  The history is provided by the patient and a relative.  Fall  She has history of hypertension and comes in following a fall 8 days ago.  She tripped over her dog's leash and hit her head with loss of consciousness.  Bystander noted loss of consciousness of 2-3 minutes.  She is continuing to complain of pain in the back of her head radiating down into her neck.  She denies any dizziness, vision change, weakness, numbness, incoordination.  She denies any nausea or vomiting.  She did take a couple doses of her father's hydrocodone-acetaminophen without relief of pain.  She is not on any anticoagulants.   Home Medications Prior to Admission medications   Medication Sig Start Date End Date Taking? Authorizing Provider  amoxicillin (AMOXIL) 500 MG capsule Take 1 capsule (500 mg total) by mouth 3 (three) times daily. 07/11/20   Eber Hong, MD  docusate sodium (COLACE) 100 MG capsule Take 100 mg by mouth 2 (two) times daily.    [provider]  LISINOPRIL PO Take 1 tablet by mouth daily.    [provider]      Allergies    Patient has no known allergies.    Review of Systems   Review of Systems  All other systems reviewed and are negative.   Physical Exam Updated Vital Signs BP 132/66   Pulse (!) 35   Temp 98.5 F (36.9 C) (Oral)   Resp (!) 21   Ht 5\' 5"  (1.651 m)   Wt 74.8 kg   SpO2 97%   BMI 27.46 kg/m  Physical Exam Vitals and nursing note reviewed.   61 year old female, resting comfortably and in no acute distress. Vital signs are significant for slow heart rate and borderline elevated respiratory rate. Oxygen saturation is 97%, which is normal. Head is normocephalic. There is no swelling or deformity.  There is mild tenderness to palpation over the  occiput PERRLA, EOMI. Oropharynx is clear. Neck is nontender without adenopathy or JVD. Back is nontender and there is no CVA tenderness. Lungs are clear without rales, wheezes, or rhonchi. Chest is nontender. Heart has regular rate and rhythm without murmur. Abdomen is soft, flat, nontender without masses or hepatosplenomegaly and peristalsis is normoactive. Extremities have no cyanosis or edema, full range of motion is present. Skin is warm and dry without rash. Neurologic: Mental status is normal, cranial nerves are intact, strength is 5/5 in all 4 extremities.  ED Results / Procedures / Treatments    EKG EKG Interpretation  Date/Time:  Monday December 05 2021 00:32:57 EDT Ventricular Rate:  49 PR Interval:  145 QRS Duration: 99 QT Interval:  431 QTC Calculation: 389 R Axis:   19 Text Interpretation: Sinus bradycardia RSR' in V1 or V2, right VCD or RVH When compared with ECG of 09/18/2020, No significant change was found Confirmed by 11/18/2020 (Dione Booze) on 12/05/2021 12:42:55 AM  Radiology CT Head Wo Contrast  Result Date: 12/05/2021 CLINICAL DATA:  Fall EXAM: CT HEAD WITHOUT CONTRAST CT CERVICAL SPINE WITHOUT CONTRAST TECHNIQUE: Multidetector CT imaging of the head and cervical spine was performed following the standard protocol without intravenous contrast. Multiplanar CT image reconstructions of the cervical spine were also generated. RADIATION DOSE REDUCTION: This exam  was performed according to the departmental dose-optimization program which includes automated exposure control, adjustment of the mA and/or kV according to patient size and/or use of iterative reconstruction technique. COMPARISON:  08/13/2005 FINDINGS: CT HEAD FINDINGS Brain: There is no mass, hemorrhage or extra-axial collection. The size and configuration of the ventricles and extra-axial CSF spaces are normal. The brain parenchyma is normal, without evidence of acute or chronic infarction. Vascular: No abnormal  hyperdensity of the major intracranial arteries or dural venous sinuses. No intracranial atherosclerosis. Skull: The visualized skull base, calvarium and extracranial soft tissues are normal. Sinuses/Orbits: No fluid levels or advanced mucosal thickening of the visualized paranasal sinuses. No mastoid or middle ear effusion. The orbits are normal. CT CERVICAL SPINE FINDINGS Alignment: No static subluxation. Facets are aligned. Occipital condyles are normally positioned. Skull base and vertebrae: No acute fracture. Soft tissues and spinal canal: No prevertebral fluid or swelling. No visible canal hematoma. Disc levels: No advanced spinal canal or neural foraminal stenosis. Upper chest: No pneumothorax, pulmonary nodule or pleural effusion. Other: Normal visualized paraspinal cervical soft tissues. IMPRESSION: 1. No acute intracranial abnormality. 2. No acute fracture or static subluxation of the cervical spine. Electronically Signed   By: Deatra Robinson M.D.   On: 12/05/2021 02:50   CT Cervical Spine Wo Contrast  Result Date: 12/05/2021 CLINICAL DATA:  Fall EXAM: CT HEAD WITHOUT CONTRAST CT CERVICAL SPINE WITHOUT CONTRAST TECHNIQUE: Multidetector CT imaging of the head and cervical spine was performed following the standard protocol without intravenous contrast. Multiplanar CT image reconstructions of the cervical spine were also generated. RADIATION DOSE REDUCTION: This exam was performed according to the departmental dose-optimization program which includes automated exposure control, adjustment of the mA and/or kV according to patient size and/or use of iterative reconstruction technique. COMPARISON:  08/13/2005 FINDINGS: CT HEAD FINDINGS Brain: There is no mass, hemorrhage or extra-axial collection. The size and configuration of the ventricles and extra-axial CSF spaces are normal. The brain parenchyma is normal, without evidence of acute or chronic infarction. Vascular: No abnormal hyperdensity of the major  intracranial arteries or dural venous sinuses. No intracranial atherosclerosis. Skull: The visualized skull base, calvarium and extracranial soft tissues are normal. Sinuses/Orbits: No fluid levels or advanced mucosal thickening of the visualized paranasal sinuses. No mastoid or middle ear effusion. The orbits are normal. CT CERVICAL SPINE FINDINGS Alignment: No static subluxation. Facets are aligned. Occipital condyles are normally positioned. Skull base and vertebrae: No acute fracture. Soft tissues and spinal canal: No prevertebral fluid or swelling. No visible canal hematoma. Disc levels: No advanced spinal canal or neural foraminal stenosis. Upper chest: No pneumothorax, pulmonary nodule or pleural effusion. Other: Normal visualized paraspinal cervical soft tissues. IMPRESSION: 1. No acute intracranial abnormality. 2. No acute fracture or static subluxation of the cervical spine. Electronically Signed   By: Deatra Robinson M.D.   On: 12/05/2021 02:50    Procedures Procedures  Cardiac monitor shows sinus bradycardia, per my interpretation.  Medications Ordered in ED Medications  ibuprofen (ADVIL) tablet 400 mg (has no administration in time range)  acetaminophen (TYLENOL) tablet 650 mg (has no administration in time range)    ED Course/ Medical Decision Making/ A&P                           Medical Decision Making Amount and/or Complexity of Data Reviewed Radiology: ordered.  Risk OTC drugs. Prescription drug management.   Closed head injury with ongoing pain and neck pain.  Doubt significant intracranial injury and doubt C-spine injury, but I have ordered CT scans of head and cervical spine to look for evidence of intracranial injury and cervical spine trauma.  She is noted to be significantly bradycardic.  I have reviewed her old records, and she has a tendency towards bradycardia noted on previous visits with heart rates as low as 41.  This is felt to be normal for her as there is no  indication that she fell because of slow heart rate and she is not complaining of any dizziness or lightheadedness currently.  I have reviewed and interpreted her ECG, and my interpretation is sinus bradycardia unchanged from prior.  CT of head and cervical spine showed no acute injury.  I have independently viewed the images, and agree with radiologist's interpretation.  Patient is advised of these findings, recommended ice and over-the-counter NSAIDs and acetaminophen for pain.  Patient is requesting prescription ibuprofen, so prescription is given for ibuprofen.  Advised to apply ice, use acetaminophen and ibuprofen as needed for pain.  Follow-up with PCP as needed.  Final Clinical Impression(s) / ED Diagnoses Final diagnoses:  Fall from slip, trip, or stumble, initial encounter  Concussion with loss of consciousness of 30 minutes or less, initial encounter  Neck sprain, initial encounter  Sinus bradycardia    Rx / DC Orders ED Discharge Orders          Ordered    ibuprofen (ADVIL) 600 MG tablet  Every 6 hours PRN        12/05/21 0309              Dione Booze, MD 12/05/21 [phone]

## 2021-12-05 NOTE — Discharge Instructions (Signed)
Apply ice for 30 minutes at a time, 4 times a day.  You may take ibuprofen as needed for pain.  Ibuprofen can be taken as often as 4 times a day.  To get additional pain relief, add acetaminophen.  When you combine acetaminophen with ibuprofen, you get better pain relief and you get from taking either medication by itself.  Your injury will take several more weeks before you are completely pain-free.  Please be patient while your body is healing the injury.

## 2022-03-13 ENCOUNTER — Emergency Department (HOSPITAL_COMMUNITY): Payer: No Typology Code available for payment source

## 2022-03-13 ENCOUNTER — Other Ambulatory Visit: Payer: Self-pay

## 2022-03-13 ENCOUNTER — Emergency Department (HOSPITAL_COMMUNITY)
Admission: EM | Admit: 2022-03-13 | Discharge: 2022-03-14 | Disposition: A | Discharge status: Home / Self Care | Payer: No Typology Code available for payment source | Attending: Student | Admitting: Student

## 2022-03-13 ENCOUNTER — Encounter (HOSPITAL_COMMUNITY): Payer: Self-pay | Admitting: *Deleted

## 2022-03-13 DIAGNOSIS — Z79899 Other long term (current) drug therapy: Secondary | ICD-10-CM | POA: Insufficient documentation

## 2022-03-13 DIAGNOSIS — U071 COVID-19: Secondary | ICD-10-CM | POA: Diagnosis not present

## 2022-03-13 DIAGNOSIS — I10 Essential (primary) hypertension: Secondary | ICD-10-CM | POA: Insufficient documentation

## 2022-03-13 DIAGNOSIS — J029 Acute pharyngitis, unspecified: Secondary | ICD-10-CM | POA: Diagnosis present

## 2022-03-13 DIAGNOSIS — R112 Nausea with vomiting, unspecified: Secondary | ICD-10-CM | POA: Insufficient documentation

## 2022-03-13 HISTORY — DX: Constipation, unspecified: K59.00

## 2022-03-13 LAB — RESP PANEL BY RT-PCR (FLU A&B, COVID) ARPGX2
Influenza A by PCR: NEGATIVE
Influenza B by PCR: NEGATIVE
SARS Coronavirus 2 by RT PCR: POSITIVE — AB

## 2022-03-13 LAB — BASIC METABOLIC PANEL
Anion gap: 17 — ABNORMAL HIGH (ref 5–15)
BUN: 45 mg/dL — ABNORMAL HIGH (ref 8–23)
CO2: 21 mmol/L — ABNORMAL LOW (ref 22–32)
Calcium: 10.1 mg/dL (ref 8.9–10.3)
Chloride: 100 mmol/L (ref 98–111)
Creatinine, Ser: 1.49 mg/dL — ABNORMAL HIGH (ref 0.44–1.00)
GFR, Estimated: 39 mL/min — ABNORMAL LOW (ref >60)
Glucose, Bld: 119 mg/dL — ABNORMAL HIGH (ref 70–99)
Potassium: 4.5 mmol/L (ref 3.5–5.1)
Sodium: 138 mmol/L (ref 135–145)

## 2022-03-13 LAB — CBC WITH DIFFERENTIAL/PLATELET
Abs Immature Granulocytes: 0.03 10*3/uL (ref 0.00–0.07)
Basophils Absolute: 0 10*3/uL (ref 0.0–0.1)
Basophils Relative: 0 %
Eosinophils Absolute: 0 10*3/uL (ref 0.0–0.5)
Eosinophils Relative: 0 %
HCT: 44.1 % (ref 36.0–46.0)
Hemoglobin: 14.1 g/dL (ref 12.0–15.0)
Immature Granulocytes: 0 %
Lymphocytes Relative: 23 %
Lymphs Abs: 2 10*3/uL (ref 0.7–4.0)
MCH: 30.5 pg (ref 26.0–34.0)
MCHC: 32 g/dL (ref 30.0–36.0)
MCV: 95.2 fL (ref 80.0–100.0)
Monocytes Absolute: 0.5 10*3/uL (ref 0.1–1.0)
Monocytes Relative: 6 %
Neutro Abs: 6 10*3/uL (ref 1.7–7.7)
Neutrophils Relative %: 71 %
Platelets: 204 10*3/uL (ref 150–400)
RBC: 4.63 MIL/uL (ref 3.87–5.11)
RDW: 12.6 % (ref 11.5–15.5)
WBC: 8.5 10*3/uL (ref 4.0–10.5)
nRBC: 0 % (ref 0.0–0.2)

## 2022-03-13 LAB — LIPASE, BLOOD: Lipase: 26 U/L (ref 11–51)

## 2022-03-13 LAB — MAGNESIUM: Magnesium: 2.4 mg/dL (ref 1.7–2.4)

## 2022-03-13 MED ORDER — LIDOCAINE VISCOUS HCL 2 % MT SOLN
15.0000 mL | Freq: Once | OROMUCOSAL | Status: AC
  Administered 2022-03-13: 15 mL via ORAL
  Filled 2022-03-13: qty 15

## 2022-03-13 MED ORDER — ALUM & MAG HYDROXIDE-SIMETH 200-200-20 MG/5ML PO SUSP
30.0000 mL | Freq: Once | ORAL | Status: AC
  Administered 2022-03-13: 30 mL via ORAL
  Filled 2022-03-13: qty 30

## 2022-03-13 MED ORDER — MOLNUPIRAVIR 200 MG PO CAPS: 4.0000 | ORAL_CAPSULE | Freq: Two times a day (BID) | ORAL | 0 refills | Status: AC

## 2022-03-13 MED ORDER — ONDANSETRON 4 MG PO TBDP
4.0000 mg | ORAL_TABLET | Freq: Once | ORAL | Status: AC
  Administered 2022-03-13: 4 mg via ORAL
  Filled 2022-03-13: qty 1

## 2022-03-13 MED ORDER — SODIUM CHLORIDE 0.9 % IV BOLUS
1000.0000 mL | Freq: Once | INTRAVENOUS | Status: AC
  Administered 2022-03-13: 1000 mL via INTRAVENOUS

## 2022-03-13 MED ORDER — ONDANSETRON 4 MG PO TBDP: 4.0000 mg | ORAL_TABLET | Freq: Three times a day (TID) | ORAL | 0 refills | Status: AC | PRN

## 2022-03-13 NOTE — ED Provider Notes (Signed)
[facility] EMERGENCY DEPARTMENT Provider Note   CSN: [id number] Arrival date & time: 03/13/22  1350     History  Chief Complaint  Patient presents with   Emesis    [name] is a 61 y.o. female.   Emesis   61 year old female presents emergency department with complaints of emesis for the past 2 to 3 days.  Patient reports she has had sore throat, cough for the past 5 to 7 days.  She reports taking at home Mucinex and Tylenol cold and flu of which have helped her symptoms.  She presents emergency department due to decreased p.o. intake and new symptom of emesis.  Emesis described as nonbloody in appearance and she has had 3-4 episodes since onset.  She denies any chest pain or shortness of breath, fever, chills, night sweats, urinary/vaginal symptoms, change in bowel habits.  Past medical history significant for hypertension, constipation  Home Medications Prior to Admission medications   Medication Sig Start Date End Date Taking? Authorizing Provider  molnupiravir EUA (LAGEVRIO) 200 MG CAPS capsule Take 4 capsules (800 mg total) by mouth 2 (two) times daily for 5 days. 03/13/22 03/18/22 Yes Sherian Maroon A, PA  ondansetron (ZOFRAN-ODT) 4 MG disintegrating tablet Take 1 tablet (4 mg total) by mouth every 8 (eight) hours as needed for nausea or vomiting. 03/13/22  Yes Sherian Maroon A, PA  docusate sodium (COLACE) 100 MG capsule Take 100 mg by mouth 2 (two) times daily.    [provider]  ibuprofen (ADVIL) 600 MG tablet Take 1 tablet (600 mg total) by mouth every 6 (six) hours as needed. 12/05/21   Dione Booze, MD  LISINOPRIL PO Take 1 tablet by mouth daily.    [provider]      Allergies    Patient has no known allergies.    Review of Systems   Review of Systems  Gastrointestinal:  Positive for vomiting.  All other systems reviewed and are negative.   Physical Exam Updated Vital Signs BP 115/85 (BP Location: Left Arm)   Pulse 68   Temp 98.2 F  (36.8 C) (Oral)   Resp 16   Ht 5\' 5"  (1.651 m)   Wt 77.1 kg   SpO2 98%   BMI 28.29 kg/m  Physical Exam Vitals and nursing note reviewed.  Constitutional:      General: She is not in acute distress.    Appearance: She is well-developed.  HENT:     Head: Normocephalic and atraumatic.     Mouth/Throat:     Comments: Mild posterior pharyngeal erythema noted.  Uvula midline rises symmetrically with phonation.  Tonsils 0-1+ bilaterally with no obvious exudate.  No submandibular or sublingual swelling noted.  Eyes:     Conjunctiva/sclera: Conjunctivae normal.  Cardiovascular:     Rate and Rhythm: Normal rate and regular rhythm.     Heart sounds: No murmur heard. Pulmonary:     Effort: Pulmonary effort is normal. No respiratory distress.     Breath sounds: Normal breath sounds. No stridor. No wheezing or rales.  Abdominal:     Palpations: Abdomen is soft.     Tenderness: There is no guarding.     Comments: Mild epigastric tenderness to palpation.  Musculoskeletal:        General: No swelling.     Cervical back: Neck supple.     Right lower leg: No edema.     Left lower leg: No edema.  Skin:    General: Skin is  warm and dry.     Capillary Refill: Capillary refill takes less than 2 seconds.     Coloration: Skin is jaundiced.  Neurological:     Mental Status: She is alert.  Psychiatric:        Mood and Affect: Mood normal.     ED Results / Procedures / Treatments   Labs (all labs ordered are listed, but only abnormal results are displayed) Labs Reviewed  RESP PANEL BY RT-PCR (FLU A&B, COVID) ARPGX2 - Abnormal; Notable for the following components:      Result Value   SARS Coronavirus 2 by RT PCR POSITIVE (*)    All other components within normal limits  BASIC METABOLIC PANEL - Abnormal; Notable for the following components:   CO2 21 (*)    Glucose, Bld 119 (*)    BUN 45 (*)    Creatinine, Ser 1.49 (*)    GFR, Estimated 39 (*)    Anion gap 17 (*)    All other  components within normal limits  CBC WITH DIFFERENTIAL/PLATELET  MAGNESIUM  LIPASE, BLOOD    EKG None  Radiology DG Chest 2 View  Result Date: 03/13/2022 CLINICAL DATA:  Shortness of breath. Emesis for 5 days. Productive cough. EXAM: CHEST - 2 VIEW COMPARISON:  AP chest 07/11/2020 FINDINGS: Cardiac silhouette and mediastinal contours are within normal limits. The lungs are clear. No pleural effusion or pneumothorax. No acute skeletal abnormality. Hernia mesh coils overlie the anterior upper abdomen. IMPRESSION: No active cardiopulmonary disease. Electronically Signed   By: Neita Garnet M.D.   On: 03/13/2022 14:29    Procedures Procedures    Medications Ordered in ED Medications  alum & mag hydroxide-simeth (MAALOX/MYLANTA) 200-200-20 MG/5ML suspension 30 mL (has no administration in time range)    And  lidocaine (XYLOCAINE) 2 % viscous mouth solution 15 mL (has no administration in time range)  sodium chloride 0.9 % bolus 1,000 mL (1,000 mLs Intravenous New Bag/Given 03/13/22 2205)  ondansetron (ZOFRAN-ODT) disintegrating tablet 4 mg (4 mg Oral Given 03/13/22 2205)    ED Course/ Medical Decision Making/ A&P                           Medical Decision Making Amount and/or Complexity of Data Reviewed Labs: ordered. Radiology: ordered.  Risk Prescription drug management.   This patient presents to the ED for concern of cough, congestion, sore throat, emesis, this involves an extensive number of treatment options, and is a complaint that carries with it a high risk of complications and morbidity.  The differential diagnosis includes COVID, influenza, viral URI, gastroenteritis, pancreatitis, gastritis, PUD, volvulus, SBO/LBO, nephrolithiasis, cholecystitis, CBD pathology, appendicitis, pneumonia, sepsis   Co morbidities that complicate the patient evaluation  See HPI   Additional history obtained:  Additional history obtained from EMR External records from outside source  obtained and reviewed including hospital records   Lab Tests:  I Ordered, and personally interpreted labs.  The pertinent results include: No leukocytosis noted.  No evidence anemia.  Platelets within range.  Mild decrease in bicarb with an increase in anion gap likely secondary to episodes of emesis and decreased p.o. intake of which patient was given 1 L of normal saline.  Otherwise, electrolytes within normal limits.  Renal function decreased with a GFR of 39, creatinine 1.49 and BUN of 45 of which is near patient's baseline from labs approximately 1 year ago.   Imaging Studies ordered:  I ordered imaging  studies including chest x-ray I independently visualized and interpreted imaging which showed no active cardiopulmonary disease I agree with the radiologist interpretation   Cardiac Monitoring: / EKG:  The patient was maintained on a cardiac monitor.  I personally viewed and interpreted the cardiac monitored which showed an underlying rhythm of: Sinus rhythm with nonspecific ST changes.   Consultations Obtained:  N/a   Problem List / ED Course / Critical interventions / Medication management  COVID.  Nausea/emesis. I ordered medication including Zofran for nausea.  1 L normal saline for rehydration.  Maalox and Xylocaine for GI cocktail.    Reevaluation of the patient after these medicines showed that the patient improved I have reviewed the patients home medicines and have made adjustments as needed   Social Determinants of Health:  Chronic cigarette use.  Denies illicit drug use.   Test / Admission - Considered:  COVID/emesis Vitals signs within normal range and stable throughout visit. Laboratory/imaging studies significant for: See above Patient's repeat abdominal exam showed mild to no abdominal tenderness.  Patient tolerated p.o. both foods and liquids after administration of Zofran is experiencing no nausea or repeat episodes of emesis; held on CT imaging of the  abdomen given shared decision making with patient who elected for response to medicines administered.  Patient educated again regarding current coronavirus infection.  Given molnupiravir for current COVID infection.  Patient educated regarding proper quarantine recommendations.  Patient showed evidence of decreased renal function of which last baseline from prior study from 1 year ago.  Patient to be discharged with Zofran to take as needed for episodes of nausea/vomiting.  Follow-up with primary care recommended in 3 to 5 days for reevaluation of symptoms.  Treatment plan discussed with patient history knowledge understanding was agreeable to said plan. Worrisome signs and symptoms were discussed with the patient, and the patient acknowledged understanding to return to the ED if noticed. Patient was stable upon discharge.          Final Clinical Impression(s) / ED Diagnoses Final diagnoses:  COVID  Nausea and vomiting, unspecified vomiting type    Rx / DC Orders ED Discharge Orders          Ordered    molnupiravir EUA (LAGEVRIO) 200 MG CAPS capsule  2 times daily        03/13/22 2300    ondansetron (ZOFRAN-ODT) 4 MG disintegrating tablet  Every 8 hours PRN        03/13/22 2300              Peter Garter, [place] 03/13/22 2304    Glendora Score, MD 03/14/22 1309

## 2022-03-13 NOTE — ED Triage Notes (Signed)
Pt with emesis x 5 days, sore throat since Friday and SOB since Saturday. + productive cough-brown in color.  LBM over a week.

## 2022-03-13 NOTE — Discharge Instructions (Addendum)
Note that your visit to the emergency department was overall reassuring.  As discussed, take antiviral twice daily for the next 5 days for your evidence of COVID infection.  Take nausea medicine Zofran as needed for nausea/vomiting.  Recommend follow-up with your primary care for reevaluation in 3 to 5 days. Please not hesitate to return to emergency department for worrisome signs and symptoms we discussed become apparent.

## 2022-03-14 NOTE — ED Notes (Addendum)
Went over [employer]. Verbalized understanding. Waiting in room for ride rt covid.

## 2022-03-29 ENCOUNTER — Ambulatory Visit (INDEPENDENT_AMBULATORY_CARE_PROVIDER_SITE_OTHER): Payer: No Typology Code available for payment source | Admitting: Podiatry

## 2022-03-29 DIAGNOSIS — I96 Gangrene, not elsewhere classified: Secondary | ICD-10-CM

## 2022-03-29 DIAGNOSIS — I999 Unspecified disorder of circulatory system: Secondary | ICD-10-CM

## 2022-03-29 NOTE — Progress Notes (Signed)
  Subjective:  Patient ID: [name], female    DOB: [birth year],  MRN: [id number]  Chief Complaint  Patient presents with   Toe Pain    61 y.o. female presents with the above complaint. Patient presents with with right forefoot dark discoloration to the big toe and fifth digit.  Patient has a history of smoking extensively.  Patient states has been more painful and progressive gotten worse he has not seen anyone as prior to seeing me he has never had ABIs PVRs or recent vascular exam done.  He does not have any vascular's surgeon as well.  He would like to discuss treatment options for this   Review of Systems: Negative except as noted in the HPI. Denies N/V/F/Ch.  Past Medical History:  Diagnosis Date   Constipation    Hypertension     Current Outpatient Medications:    docusate sodium (COLACE) 100 MG capsule, Take 100 mg by mouth 2 (two) times daily., Disp: , Rfl:    ibuprofen (ADVIL) 600 MG tablet, Take 1 tablet (600 mg total) by mouth every 6 (six) hours as needed., Disp: 30 tablet, Rfl: 0   LISINOPRIL PO, Take 1 tablet by mouth daily., Disp: , Rfl:    ondansetron (ZOFRAN-ODT) 4 MG disintegrating tablet, Take 1 tablet (4 mg total) by mouth every 8 (eight) hours as needed for nausea or vomiting., Disp: 20 tablet, Rfl: 0   traMADol (ULTRAM) 50 MG tablet, Take 1 tablet (50 mg total) by mouth at bedtime as needed for severe pain., Disp: 10 tablet, Rfl: 0  Social History   Tobacco Use  Smoking Status Every Day   Packs/day: 1.00   Types: Cigarettes  Smokeless Tobacco Never    No Known Allergies Objective:  There were no vitals filed for this visit. There is no height or weight on file to calculate BMI. Constitutional Well developed. Well nourished.  Vascular Dorsalis pedis pulses nonpalpable bilaterally. Posterior tibial pulses nonpalpable bilaterally. Capillary refill sluggish o all digits.  No cyanosis or clubbing noted. Pedal hair growth normal.  Neurologic Normal  speech. Oriented to person, place, and time. Epicritic sensation to light touch grossly present bilaterally.  Dermatologic Nails well groomed and normal in appearance. No open wounds. No skin lesions.  Orthopedic: Patient presents with right hallux and fifth digit with discoloration some beginning signs of gangrene noted.  No infection noted.  Dry stable noted.   Radiographs: None Assessment:   1. Vascular abnormality   2. Gangrene of toe (HCC)    Plan:  Patient was evaluated and treated and all questions answered.  Right forefoot dark discoloration with big toe and fifth digit with a history of smoking likely Buerger's disease -All questions and concerns were discussed with the patient in extensive detail. -Anticipate a lot of his pain is coming from vascular origin.  At this time patient would benefit from ABIs PVRs ABIs were ordered. -He will follow-up with Dr. Gery Pray for further intervention.  No follow-ups on file.   Right forefoot dark discoloration to the big toe fifth digit patient has history of smoking likely Buerger's disease.  ABIs PVRs ordered

## 2022-03-30 ENCOUNTER — Emergency Department (HOSPITAL_COMMUNITY)
Admission: EM | Admit: 2022-03-30 | Discharge: 2022-03-30 | Disposition: A | Discharge status: Home / Self Care | Payer: No Typology Code available for payment source | Attending: Emergency Medicine | Admitting: Emergency Medicine

## 2022-03-30 ENCOUNTER — Encounter (HOSPITAL_COMMUNITY): Payer: Self-pay | Admitting: Emergency Medicine

## 2022-03-30 ENCOUNTER — Emergency Department (HOSPITAL_COMMUNITY): Payer: No Typology Code available for payment source

## 2022-03-30 ENCOUNTER — Other Ambulatory Visit: Payer: Self-pay

## 2022-03-30 DIAGNOSIS — M79671 Pain in right foot: Secondary | ICD-10-CM | POA: Diagnosis not present

## 2022-03-30 MED ORDER — TRAMADOL HCL 50 MG PO TABS: 50.0000 mg | ORAL_TABLET | Freq: Every evening | ORAL | 0 refills | Status: DC | PRN

## 2022-03-30 NOTE — Discharge Instructions (Addendum)
Your exam today is reassuring as we are able to easily find a pulse in your foot.  You do need to proceed with the tests that Dr. Allena Katz is ordering for you however.  In the interim you can take a pain pill which I have prescribed you before bedtime to help you with your nighttime pain.

## 2022-03-30 NOTE — ED Provider Notes (Signed)
[facility] EMERGENCY DEPARTMENT Provider Note   CSN: [id number] Arrival date & time: 03/30/22  1200     History  Chief Complaint  Patient presents with   Foot Pain    [name] is a 61 y.o. female presenting for evaluation of pain in her right foot, specifically across her dorsal feet including her great toe and small toes, with noted dusky appearance of the skin.  This symptom has been present for the past 3 weeks.  She describes pain that is better with exertion, worse when she tries to sleep at night, described as an aching pain.  She saw Dr. 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  The plan was for an outpatient ABI testing.  Patient was concerned about waiting too long for this test so she presented here.  Of note however she states that she was in the waiting room Dr. Eliane Decree office called her to set up this test.  The history is provided by the patient.       Home Medications Prior to Admission medications   Medication Sig Start Date End Date Taking? Authorizing Provider  traMADol (ULTRAM) 50 MG tablet Take 1 tablet (50 mg total) by mouth at bedtime as needed for severe pain. 03/30/22  Yes Andriel Omalley, Raynelle Fanning, PA-C  docusate sodium (COLACE) 100 MG capsule Take 100 mg by mouth 2 (two) times daily.    [provider]  ibuprofen (ADVIL) 600 MG tablet Take 1 tablet (600 mg total) by mouth every 6 (six) hours as needed. 12/05/21   Dione Booze, MD  LISINOPRIL PO Take 1 tablet by mouth daily.    [provider]  ondansetron (ZOFRAN-ODT) 4 MG disintegrating tablet Take 1 tablet (4 mg total) by mouth every 8 (eight) hours as needed for nausea or vomiting. 03/13/22   Peter Garter, PA      Allergies    Patient has no known allergies.    Review of Systems   Review of Systems  Constitutional:  Negative for chills and fever.  HENT:   Negative for congestion and sore throat.   Eyes: Negative.   Respiratory:  Negative for chest tightness and shortness of breath.   Cardiovascular:  Negative for chest pain.  Gastrointestinal:  Negative for abdominal pain and nausea.  Genitourinary: Negative.   Musculoskeletal:  Positive for arthralgias. Negative for joint swelling and neck pain.  Skin:  Positive for color change. Negative for rash and wound.  Neurological:  Negative for dizziness, weakness, light-headedness, numbness and headaches.  Psychiatric/Behavioral: Negative.    All other systems reviewed and are negative.   Physical Exam Updated Vital Signs BP (!) 108/53   Pulse (!) 51   Temp 98.6 F (37 C)   Resp 16   SpO2 100%  Physical Exam Constitutional:      Appearance: She is well-developed.  HENT:     Head: Atraumatic.  Cardiovascular:     Comments: Pulses equal bilaterally Musculoskeletal:        General: Tenderness present. No swelling or deformity.     Cervical back: Normal range of motion.     Right lower leg: No edema.     Left lower leg: No edema.     Right foot: Normal capillary refill. No swelling or deformity. Normal pulse.     Left foot: Normal. Normal capillary refill. Normal pulse.     Comments:  Patient does have a bruise/dusky appearance to his small focus of skin lateral to her right great toe and fifth toe.  She has normal sensation in her feet and toes.  She has 2-second cap refill in all of her toes including at the sites of the dusky appearance.  A bedside Doppler easily located her dorsalis pedis pulse bilaterally.  Skin:    General: Skin is warm and dry.  Neurological:     Mental Status: She is alert.     Sensory: No sensory deficit.     Motor: No weakness.     Deep Tendon Reflexes: Reflexes normal.     ED Results / Procedures / Treatments   Labs (all labs ordered are listed, but only abnormal results are displayed) Labs Reviewed - No data to display  EKG None  Radiology DG  Foot Complete Right  Result Date: 03/30/2022 CLINICAL DATA:  pain/swelling EXAM: RIGHT FOOT COMPLETE - 3+ VIEW COMPARISON:  None Available. FINDINGS: No evidence of acute fracture or joint malalignment. Mild for age bony degenerative change. Small plantar calcaneal enthesophytes. IMPRESSION: No evidence of acute bony abnormality. Electronically Signed   By: Feliberto Harts M.D.   On: 03/30/2022 13:10    Procedures Procedures    Medications Ordered in ED Medications - No data to display  ED Course/ Medical Decision Making/ A&P                           Medical Decision Making Patient with foot pain and concern for possible vascular compromise under the care of a podiatrist who has arranged outpatient evaluation and management of this problem.  Patient has adequate pulses in her feet here, there is no indication for emergent workup of this problem today.  She was given reassurance, return precautions were discussed however for worsening symptoms.  She was given a small course of pain medication for her at bedtime use.  Plan follow-up with Dr. Allena Katz as scheduled.  Amount and/or Complexity of Data Reviewed Radiology: ordered.           Final Clinical Impression(s) / ED Diagnoses Final diagnoses:  Right foot pain    Rx / DC Orders ED Discharge Orders          Ordered    traMADol (ULTRAM) 50 MG tablet  At bedtime PRN        03/30/22 1555              Burgess Amor, Cordelia Poche 03/30/22 1814    Jacalyn Lefevre, MD 03/30/22 [phone]

## 2022-03-30 NOTE — ED Triage Notes (Addendum)
Pt c/o right foot pain at proximal great toe and pinky joints x 3 weeks Pt thought was ingrown toenails. Went to foot dr and they told her it was decreased blood to area and possibly gangrenous per pt. Area is very warm and tender to any touch. Mild swelling noted to this area. Pedal pulse present. Denies injury.

## 2022-03-31 ENCOUNTER — Other Ambulatory Visit: Payer: Self-pay | Admitting: Podiatry

## 2022-03-31 ENCOUNTER — Ambulatory Visit (HOSPITAL_COMMUNITY)
Admission: RE | Admit: 2022-03-31 | Discharge: 2022-03-31 | Disposition: A | Discharge status: Home / Self Care | Payer: No Typology Code available for payment source | Source: Ambulatory Visit | Attending: Cardiology | Admitting: Cardiology

## 2022-03-31 DIAGNOSIS — I999 Unspecified disorder of circulatory system: Secondary | ICD-10-CM | POA: Diagnosis not present

## 2022-04-04 ENCOUNTER — Telehealth: Payer: Self-pay | Admitting: Student in an Organized Health Care Education/Training Program

## 2022-04-04 ENCOUNTER — Encounter: Payer: Self-pay | Admitting: Cardiovascular Disease

## 2022-04-04 ENCOUNTER — Ambulatory Visit
Payer: No Typology Code available for payment source | Attending: Cardiovascular Disease | Admitting: Cardiovascular Disease

## 2022-04-04 VITALS — BP 122/80 | HR 51 | Ht 65.0 in | Wt 140.6 lb

## 2022-04-04 DIAGNOSIS — I10 Essential (primary) hypertension: Secondary | ICD-10-CM

## 2022-04-04 DIAGNOSIS — Z720 Tobacco use: Secondary | ICD-10-CM

## 2022-04-04 DIAGNOSIS — I70223 Atherosclerosis of native arteries of extremities with rest pain, bilateral legs: Secondary | ICD-10-CM | POA: Insufficient documentation

## 2022-04-04 NOTE — Assessment & Plan Note (Signed)
Resting pain with acrocyanosis on the right.  She is asymptomatic on the left.  Dopplers performed 03/31/2022 revealed a right ABI of 0.73, left ABI of 0.70.  She has a high-frequency signal in her distal right SFA, occluded right posterior tibial and posterior tibial arteries.  Her left popliteal is occluded as well.  She has resting pain and critical ischemia and wishes to proceed with outpatient endovascular therapy for limb salvage.

## 2022-04-04 NOTE — Telephone Encounter (Addendum)
Received page from [facility] regarding critical lab value with K 6.3. Seen today in clinic for critical limb ischemia by Dr. Allyson Sabal. Planned for OP endovascular therapy for limb salvage of her RLE. Preop labs ordered (CBC/BMP). ECG reviewed by Dr. Allyson Sabal but not accessible/not released for review.   Lab work is not consistent with prior values. CBC with WBC 6.2, Hb 9.8, plt 237. BMP with Na 141, K 6.3.   Contacted patient ([phone]) and discussed lab results.I suspect these values are spurious as she has never been hyperkalemia in the past. Doesn't report labs were hemolyzed on faxed copy. She isn't a dialysis patient and doesn't have a reason for having significant hyperkalemia. I explained that we could recheck this tomorrow and that the likelihood of this degree of true hyperkalemia is low. That said there isn't anyway for me to rule it out without repeat labs. She would feel best about getting it evaluated tonight. She is heading to [facility] with family. Will need CBC/BMP/Mg checked.

## 2022-04-04 NOTE — Progress Notes (Signed)
04/04/2022 [name]   11-27-1960  [id number]  Primary Physician Center, [facility] Primary Cardiologist: Runell Gess MD [name], [place]  HPI:  [name] is a 61 y.o. thin-appearing divorced African-American female mother of 3 children who is retired from working at [employer].  She was referred by Dr. Nicholes Rough, podiatrist, for critical limb ischemia.  Risk factors include treated hypertension and ongoing tobacco abuse having smoked 45 pack years and currently smoking one third of a pack a day.  There is no family history for heart disease.  She is never had an attack or stroke.  She denies chest pain or shortness of breath.  She developed pain in her right foot approxi-3 weeks ago and now has some acrocyanosis.  She was seen in the ER for this on 03/30/2022.  Recent Doppler studies performed 03/31/2022 revealed a right ABI of 0.73 and a left of 0.70.  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   Current Meds  Medication Sig   docusate sodium (COLACE) 100 MG capsule Take 100 mg by mouth 2 (two) times daily.   ibuprofen (ADVIL) 600 MG tablet Take 1 tablet (600 mg total) by mouth every 6 (six) hours as needed.   LISINOPRIL PO Take 1 tablet by mouth daily.   ondansetron (ZOFRAN-ODT) 4 MG disintegrating tablet Take 1 tablet (4 mg total) by mouth every 8 (eight) hours as needed for nausea or vomiting.   traMADol (ULTRAM) 50 MG tablet Take 1 tablet (50 mg total) by mouth at bedtime as needed for severe pain.     No Known Allergies  Social History   Socioeconomic History   Marital status: Legally Separated    Spouse name: Not on file   Number of children: Not on file   Years of education: Not on file   Highest education level: Not on file  Occupational History   Not on file  Tobacco Use   Smoking status: Every Day    Packs/day: 1.00    Types: Cigarettes    Smokeless tobacco: Never  Vaping Use   Vaping Use: Never used  Substance and Sexual Activity   Alcohol use: No   Drug use: Not Currently   Sexual activity: Not on file  Other Topics Concern   Not on file  Social History Narrative   Not on file   Social Determinants of Health   Financial Resource Strain: Not on file  Food Insecurity: Not on file  Transportation Needs: Not on file  Physical Activity: Not on file  Stress: Not on file  Social Connections: Not on file  Intimate Partner Violence: Not on file     Review of Systems: General: negative for chills, fever, night sweats or weight changes.  Cardiovascular: negative for chest pain, dyspnea on exertion, edema, orthopnea, palpitations, paroxysmal nocturnal dyspnea or shortness of breath Dermatological: negative for rash Respiratory: negative for cough or wheezing Urologic: negative for hematuria Abdominal: negative for nausea, vomiting, diarrhea, bright red blood per rectum, melena, or hematemesis Neurologic: negative for visual changes, syncope, or dizziness All other systems reviewed and are otherwise negative except as noted above.    Blood pressure 122/80, pulse (!) 51, height 5\' 5"  (1.651 m), weight 140 lb 9.6 oz (63.8 kg), SpO2 99 %.  General appearance: alert and no distress Neck: no adenopathy, no carotid bruit, no  JVD, supple, symmetrical, trachea midline, and thyroid not enlarged, symmetric, no tenderness/mass/nodules Lungs: clear to auscultation bilaterally Heart: regular rate and rhythm, S1, S2 normal, no murmur, click, rub or gallop Extremities: extremities normal, atraumatic, no cyanosis or edema Pulses: Diminished pedal pulses Skin: Cyanosis right foot, distal aspect Neurologic: Grossly normal  EKG sinus bradycardia 51 without ST or T wave changes.  Personally reviewed this EKG.  ASSESSMENT AND PLAN:   Essential hypertension History of essential hypertension blood pressure measured today at 122/80.   She is on lisinopril.  Tobacco abuse History of tobacco abuse having smoked 1 pack a day for the last 45 years currently smoking one third of a pack a day.  Critical limb ischemia of both lower extremities (HCC) Resting pain with acrocyanosis on the right.  She is asymptomatic on the left.  Dopplers performed 03/31/2022 revealed a right ABI of 0.73, left ABI of 0.70.  She has a high-frequency signal in her distal right SFA, occluded right posterior tibial and posterior tibial arteries.  Her left popliteal is occluded as well.  She has resting pain and critical ischemia and wishes to proceed with outpatient endovascular therapy for limb salvage.     Runell Gess MD FACP,FACC,FAHA, [facility] 04/04/2022 11:45 AM

## 2022-04-04 NOTE — Assessment & Plan Note (Signed)
History of essential hypertension blood pressure measured today at 122/80.  She is on lisinopril.

## 2022-04-04 NOTE — Assessment & Plan Note (Signed)
History of tobacco abuse having smoked 1 pack a day for the last 45 years currently smoking one third of a pack a day.

## 2022-04-04 NOTE — H&P (View-Only) (Signed)
04/04/2022 [name]   [birth year]  [id number]  Primary Physician [facility], [facility] Primary Cardiologist: Runell Gess MD [name], [place]  HPI:  [name] is a 61 y.o. thin-appearing divorced African-American female mother of 3 children who is retired from working at [employer].  She was referred by Dr. Nicholes Nichols, podiatrist, for critical limb ischemia.  Risk factors include treated hypertension and ongoing tobacco abuse having smoked 45 pack years and currently smoking one third of a pack a day.  There is no family history for heart disease.  She is never had an attack or stroke.  She denies chest pain or shortness of breath.  She developed pain in her right foot approxi-3 weeks ago and now has some acrocyanosis.  She was seen in the ER for this on 03/30/2022.  Recent Doppler studies performed 03/31/2022 revealed a right ABI of 0.73 and a left of 0.70.  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   Current Meds  Medication Sig   docusate sodium (COLACE) 100 MG capsule Take 100 mg by mouth 2 (two) times daily.   ibuprofen (ADVIL) 600 MG tablet Take 1 tablet (600 mg total) by mouth every 6 (six) hours as needed.   LISINOPRIL PO Take 1 tablet by mouth daily.   ondansetron (ZOFRAN-ODT) 4 MG disintegrating tablet Take 1 tablet (4 mg total) by mouth every 8 (eight) hours as needed for nausea or vomiting.   traMADol (ULTRAM) 50 MG tablet Take 1 tablet (50 mg total) by mouth at bedtime as needed for severe pain.     No Known Allergies  Social History   Socioeconomic History   Marital status: Legally Separated    Spouse name: Not on file   Number of children: Not on file   Years of education: Not on file   Highest education level: Not on file  Occupational History   Not on file  Tobacco Use   Smoking status: Every Day    Packs/day: 1.00    Types: Cigarettes    Smokeless tobacco: Never  Vaping Use   Vaping Use: Never used  Substance and Sexual Activity   Alcohol use: No   Drug use: Not Currently   Sexual activity: Not on file  Other Topics Concern   Not on file  Social History Narrative   Not on file   Social Determinants of Health   Financial Resource Strain: Not on file  Food Insecurity: Not on file  Transportation Needs: Not on file  Physical Activity: Not on file  Stress: Not on file  Social Connections: Not on file  Intimate Partner Violence: Not on file     Review of Systems: General: negative for chills, fever, night sweats or weight changes.  Cardiovascular: negative for chest pain, dyspnea on exertion, edema, orthopnea, palpitations, paroxysmal nocturnal dyspnea or shortness of breath Dermatological: negative for rash Respiratory: negative for cough or wheezing Urologic: negative for hematuria Abdominal: negative for nausea, vomiting, diarrhea, bright red blood per rectum, melena, or hematemesis Neurologic: negative for visual changes, syncope, or dizziness All other systems reviewed and are otherwise negative except as noted above.    Blood pressure 122/80, pulse (!) 51, height 5\' 5"  (1.651 m), weight 140 lb 9.6 oz (63.8 kg), SpO2 99 %.  General appearance: alert and no distress Neck: no adenopathy, no carotid bruit, no  JVD, supple, symmetrical, trachea midline, and thyroid not enlarged, symmetric, no tenderness/mass/nodules Lungs: clear to auscultation bilaterally Heart: regular rate and rhythm, S1, S2 normal, no murmur, click, rub or gallop Extremities: extremities normal, atraumatic, no cyanosis or edema Pulses: Diminished pedal pulses Skin: Cyanosis right foot, distal aspect Neurologic: Grossly normal  EKG sinus bradycardia 51 without ST or T wave changes.  Personally reviewed this EKG.  ASSESSMENT AND PLAN:   Essential hypertension History of essential hypertension blood pressure measured today at 122/80.   She is on lisinopril.  Tobacco abuse History of tobacco abuse having smoked 1 pack a day for the last 45 years currently smoking one third of a pack a day.  Critical limb ischemia of both lower extremities (HCC) Resting pain with acrocyanosis on the right.  She is asymptomatic on the left.  Dopplers performed 03/31/2022 revealed a right ABI of 0.73, left ABI of 0.70.  She has a high-frequency signal in her distal right SFA, occluded right posterior tibial and posterior tibial arteries.  Her left popliteal is occluded as well.  She has resting pain and critical ischemia and wishes to proceed with outpatient endovascular therapy for limb salvage.     Runell Gess MD FACP,FACC,FAHA, [facility] 04/04/2022 11:45 AM

## 2022-04-04 NOTE — Patient Instructions (Signed)
Medication Instructions:  Your physician recommends that you continue on your current medications as directed. Please refer to the Current Medication list given to you today.  *If you need a refill on your cardiac medications before your next appointment, please call your pharmacy*   Lab Work: Your physician recommends that you have labs drawn today: BMET & CBC  If you have labs (blood work) drawn today and your tests are completely normal, you will receive your results only by: MyChart Message (if you have MyChart) OR A paper copy in the mail If you have any lab test that is abnormal or we need to change your treatment, we will call you to review the results.   Testing/Procedures: Your physician has requested that you have a lower extremity arterial duplex. During this test, ultrasound is used to evaluate arterial blood flow in the legs. Allow one hour for this exam. There are no restrictions or special instructions. This will take place at [address], [address]. To be done 1-2 weeks after procedure (12/28).  Your physician has requested that you have an ankle brachial index (ABI). During this test an ultrasound and blood pressure cuff are used to evaluate the arteries that supply the arms and legs with blood. Allow thirty minutes for this exam. There are no restrictions or special instructions. This will take place at [address], [address].  To be done 1-2 weeks after procedure (12/28).     Follow-Up: At [facility], you and your health needs are our priority.  As part of our continuing mission to provide you with exceptional heart care, we have created designated Provider Care Teams.  These Care Teams include your primary Cardiologist (physician) and Advanced Practice Providers (APPs -  Physician Assistants and Nurse Practitioners) who all work together to provide you with the care you need, when you need it.  We recommend signing up for the patient portal  called "MyChart".  Sign up information is provided on this After Visit Summary.  MyChart is used to connect with patients for Virtual Visits (Telemedicine).  Patients are able to view lab/test results, encounter notes, upcoming appointments, etc.  Non-urgent messages can be sent to your provider as well.   To learn more about what you can do with MyChart, go to [email or web address].    Your next appointment:   2-3 week(s) after procedure (12/28)  The format for your next appointment:   In Person  Provider:   Nanetta Batty, MD    Other Instructions       Cardiac/Peripheral Catheterization   You are scheduled for a Peripheral Angiogram on Thursday, December 28 with Dr. Nanetta Batty.  1. Please arrive at the Main Entrance A at [facility]: [address] [place], [place] [address] on December 28 at 5:30 AM (This time is two hours before your procedure to ensure your preparation). Free valet parking service is available. You will check in at ADMITTING. The support person will be asked to wait in the waiting room.  It is OK to have someone drop you off and come back when you are ready to be discharged.        Special note: Every effort is made to have your procedure done on time. Please understand that emergencies sometimes delay scheduled procedures.   . 2. Diet: Do not eat solid foods after midnight.  You may have clear liquids until 5 AM the day of the procedure.  3. Labs: You will need to  have blood drawn today (12/28).  4. Medication instructions in preparation for your procedure:   On the morning of your procedure, take Aspirin 81 mg and any morning medicines NOT listed above.  You may use sips of water.  5. Plan to go home the same day, you will only stay overnight if medically necessary. 6. You MUST have a responsible adult to drive you home. 7. An adult MUST be with you the first 24 hours after you arrive home. 8. Bring a current list of your medications, and  the last time and date medication taken. 9. Bring ID and current insurance cards. 10.Please wear clothes that are easy to get on and off and wear slip-on shoes.  Thank you for allowing [place] to care for you!   -- [facility]

## 2022-04-04 NOTE — Telephone Encounter (Incomplete)
Received page from [facility] regarding critical lab value with K 6.3. Seen today in clinic for critical limb ischemia by Dr. Allyson Sabal. Planned for OP endovascular therapy for limb salvage of her RLE. Preop labs ordered (CBC/BMP). ECG reviewed by Dr. Allyson Sabal but not accessible/not released for review.   Lab work is not consistent with prior values. CBC with WBC 6.2, Hb (9.6), plt 237. BMP with Na 141, K 6.3.   Attempted to contact patient twice ([phone]) however no answer and no VM set up. Suspect these values are spurious as she has never been hyperkalemic in the past. Doesn't report labs were hemolyzed on faxed copy. [place]

## 2022-04-05 ENCOUNTER — Other Ambulatory Visit: Payer: Self-pay

## 2022-04-05 ENCOUNTER — Telehealth: Payer: Self-pay | Admitting: *Deleted

## 2022-04-05 ENCOUNTER — Emergency Department (HOSPITAL_COMMUNITY)
Admission: EM | Admit: 2022-04-05 | Discharge: 2022-04-05 | Disposition: A | Discharge status: Home / Self Care | Payer: No Typology Code available for payment source | Attending: Emergency Medicine | Admitting: Emergency Medicine

## 2022-04-05 DIAGNOSIS — I739 Peripheral vascular disease, unspecified: Secondary | ICD-10-CM | POA: Insufficient documentation

## 2022-04-05 LAB — CBC
HCT: 29.8 % — ABNORMAL LOW (ref 36.0–46.0)
Hematocrit: 29.3 % — ABNORMAL LOW (ref 34.0–46.6)
Hemoglobin: 9.8 g/dL — ABNORMAL LOW (ref 11.1–15.9)
Hemoglobin: 9.5 g/dL — ABNORMAL LOW (ref 12.0–15.0)
MCH: 30.1 pg (ref 26.6–33.0)
MCH: 30.8 pg (ref 26.0–34.0)
MCHC: 33.4 g/dL (ref 31.5–35.7)
MCHC: 31.9 g/dL (ref 30.0–36.0)
MCV: 90 fL (ref 79–97)
MCV: 96.8 fL (ref 80.0–100.0)
Platelets: 237 10*3/uL (ref 150–450)
Platelets: 205 10*3/uL (ref 150–400)
RBC: 3.26 x10E6/uL — ABNORMAL LOW (ref 3.77–5.28)
RBC: 3.08 MIL/uL — ABNORMAL LOW (ref 3.87–5.11)
RDW: 11.4 % — ABNORMAL LOW (ref 11.7–15.4)
RDW: 13.2 % (ref 11.5–15.5)
WBC: 6.2 10*3/uL (ref 3.4–10.8)
WBC: 6.5 10*3/uL (ref 4.0–10.5)
nRBC: 0 % (ref 0.0–0.2)

## 2022-04-05 LAB — BASIC METABOLIC PANEL
Anion gap: 7 (ref 5–15)
BUN/Creatinine Ratio: 12 (ref 12–28)
BUN: 29 mg/dL — ABNORMAL HIGH (ref 8–27)
BUN: 28 mg/dL — ABNORMAL HIGH (ref 8–23)
CO2: 22 mmol/L (ref 20–29)
CO2: 25 mmol/L (ref 22–32)
Calcium: 10.2 mg/dL (ref 8.7–10.3)
Calcium: 9.3 mg/dL (ref 8.9–10.3)
Chloride: 104 mmol/L (ref 96–106)
Chloride: 106 mmol/L (ref 98–111)
Creatinine, Ser: 2.33 mg/dL — ABNORMAL HIGH (ref 0.57–1.00)
Creatinine, Ser: 1.87 mg/dL — ABNORMAL HIGH (ref 0.44–1.00)
GFR, Estimated: 30 mL/min — ABNORMAL LOW (ref >60)
Glucose, Bld: 135 mg/dL — ABNORMAL HIGH (ref 70–99)
Glucose: 72 mg/dL (ref 70–99)
Potassium: 6.3 mmol/L (ref 3.5–5.2)
Potassium: 5 mmol/L (ref 3.5–5.1)
Sodium: 141 mmol/L (ref 134–144)
Sodium: 138 mmol/L (ref 135–145)
eGFR: 23 mL/min/{1.73_m2} — ABNORMAL LOW (ref >59)

## 2022-04-05 NOTE — ED Provider Notes (Signed)
[facility] EMERGENCY DEPARTMENT Provider Note   CSN: [id number] Arrival date & time: 04/05/22  0044     History  Chief Complaint  Patient presents with   Abnormal Lab    [name] is a 61 y.o. female.  Patient presents to the emergency department for evaluation of abnormal labs.  Patient is currently being evaluated for treatment of peripheral arterial disease.  She is scheduled for abdominal aortogram with lower extremity tomorrow.  She had outpatient labs yesterday and was called by the office, told that her potassium was 6.3 and she needed to come to the emergency room.       Home Medications Prior to Admission medications   Medication Sig Start Date End Date Taking? Authorizing Provider  docusate sodium (COLACE) 100 MG capsule Take 100 mg by mouth 2 (two) times daily.    [provider]  ibuprofen (ADVIL) 600 MG tablet Take 1 tablet (600 mg total) by mouth every 6 (six) hours as needed. 12/05/21   Dione Booze, MD  LISINOPRIL PO Take 1 tablet by mouth daily.    [provider]  ondansetron (ZOFRAN-ODT) 4 MG disintegrating tablet Take 1 tablet (4 mg total) by mouth every 8 (eight) hours as needed for nausea or vomiting. 03/13/22   Peter Garter, PA  traMADol (ULTRAM) 50 MG tablet Take 1 tablet (50 mg total) by mouth at bedtime as needed for severe pain. 03/30/22   Burgess Amor, PA-C      Allergies    Patient has no known allergies.    Review of Systems   Review of Systems  Physical Exam Updated Vital Signs BP (!) 149/69   Pulse (!) 52   Temp 98.2 F (36.8 C)   Resp 17   Ht 5\' 5"  (1.651 m)   Wt 63.7 kg   SpO2 100%   BMI 23.37 kg/m  Physical Exam Vitals and nursing note reviewed.  Constitutional:      General: She is not in acute distress.    Appearance: She is well-developed.  HENT:     Head: Normocephalic and atraumatic.     Mouth/Throat:     Mouth: Mucous membranes are moist.  Eyes:     General: Vision grossly intact. Gaze  aligned appropriately.     Extraocular Movements: Extraocular movements intact.     Conjunctiva/sclera: Conjunctivae normal.  Cardiovascular:     Rate and Rhythm: Normal rate and regular rhythm.     Pulses: Normal pulses.     Heart sounds: Normal heart sounds, S1 normal and S2 normal. No murmur heard.    No friction rub. No gallop.  Pulmonary:     Effort: Pulmonary effort is normal. No respiratory distress.     Breath sounds: Normal breath sounds.  Abdominal:     General: Bowel sounds are normal.     Palpations: Abdomen is soft.     Tenderness: There is no abdominal tenderness. There is no guarding or rebound.     Hernia: No hernia is present.  Musculoskeletal:        General: No swelling.     Cervical back: Full passive range of motion without pain, normal range of motion and neck supple. No spinous process tenderness or muscular tenderness. Normal range of motion.     Right lower leg: No edema.     Left lower leg: No edema.     Comments: Right lower extremity is warm to the touch, has some bluish discoloration to the first and  fifth toe, reports that this has been like this for a month and Dr. Allyson Sabal knows about it  Skin:    General: Skin is warm and dry.     Capillary Refill: Capillary refill takes less than 2 seconds.     Findings: No ecchymosis, erythema, rash or wound.  Neurological:     General: No focal deficit present.     Mental Status: She is alert and oriented to person, place, and time.     GCS: GCS eye subscore is 4. GCS verbal subscore is 5. GCS motor subscore is 6.     Cranial Nerves: Cranial nerves 2-12 are intact.     Sensory: Sensation is intact.     Motor: Motor function is intact.     Coordination: Coordination is intact.  Psychiatric:        Attention and Perception: Attention normal.        Mood and Affect: Mood normal.        Speech: Speech normal.        Behavior: Behavior normal.     ED Results / Procedures / Treatments   Labs (all labs ordered are  listed, but only abnormal results are displayed) Labs Reviewed  CBC - Abnormal; Notable for the following components:      Result Value   RBC 3.08 (*)    Hemoglobin 9.5 (*)    HCT 29.8 (*)    All other components within normal limits  BASIC METABOLIC PANEL - Abnormal; Notable for the following components:   Glucose, Bld 135 (*)    BUN 28 (*)    Creatinine, Ser 1.87 (*)    GFR, Estimated 30 (*)    All other components within normal limits    EKG None  Radiology No results found.  Procedures Procedures    Medications Ordered in ED Medications - No data to display  ED Course/ Medical Decision Making/ A&P                           Medical Decision Making Amount and/or Complexity of Data Reviewed Labs: ordered.   Reviewed labs from yesterday, creatinine 2.3, potassium 6.3.  These were repeated tonight and numbers are much more in line with her baseline.  She does have baseline renal insufficiency.  Repeat potassium today 5.0.  Examination of her lower extremity reveals that there is some discoloration to the first and fifth toe but she reports that it has been like that for a month and a half.  No change in color, no change in her pain or claudication.  As she is no longer hyperkalemic and her creatinine is back to baseline, does not require hospitalization.  She needs to follow-up with Dr. Allyson Sabal.        Final Clinical Impression(s) / ED Diagnoses Final diagnoses:  PAD (peripheral artery disease) (HCC)    Rx / DC Orders ED Discharge Orders     None         Wil Slape, Canary Brim, MD 04/05/22 0405

## 2022-04-05 NOTE — ED Triage Notes (Signed)
Pt is scheduled to have surgery tomorrow and had lab work done yesterday. States she was called and told to come to the ED to have her labs redrawn as her potassium was 6.3.

## 2022-04-05 NOTE — Telephone Encounter (Signed)
Abdominal aortogram scheduled at [facility] for: Thursday April 06, 2022 9:30 AM Arrival time and place: [facility] Main Entrance A at: 5:30 AM-pre-procedure hydration  Nothing to eat after midnight prior to procedure, clear liquids until 5 AM day of procedure.  Medication instructions: -Hold:  Lisinopril/Ibuprofen-day before and day of procedure-per protocol GFR 30 -Except hold medications usual morning medications can be taken with sips of water including aspirin 81 mg.  Confirmed patient has responsible adult to drive home post procedure and be with patient first 24 hours after arriving home.  Reviewed procedure instructions, pre-procedure hydration with patient.

## 2022-04-06 ENCOUNTER — Other Ambulatory Visit: Payer: Self-pay

## 2022-04-06 ENCOUNTER — Ambulatory Visit (HOSPITAL_COMMUNITY)
Admission: RE | Disposition: A | Discharge status: Home / Self Care | Payer: Self-pay | Source: Home / Self Care | Attending: Cardiovascular Disease

## 2022-04-06 ENCOUNTER — Ambulatory Visit (HOSPITAL_COMMUNITY)
Admission: RE | Admit: 2022-04-06 | Discharge: 2022-04-07 | Disposition: A | Discharge status: Home / Self Care | Payer: No Typology Code available for payment source | Attending: Cardiovascular Disease | Admitting: Cardiovascular Disease

## 2022-04-06 DIAGNOSIS — E875 Hyperkalemia: Secondary | ICD-10-CM

## 2022-04-06 DIAGNOSIS — Z79899 Other long term (current) drug therapy: Secondary | ICD-10-CM | POA: Diagnosis not present

## 2022-04-06 DIAGNOSIS — I70223 Atherosclerosis of native arteries of extremities with rest pain, bilateral legs: Secondary | ICD-10-CM

## 2022-04-06 DIAGNOSIS — I129 Hypertensive chronic kidney disease with stage 1 through stage 4 chronic kidney disease, or unspecified chronic kidney disease: Secondary | ICD-10-CM | POA: Insufficient documentation

## 2022-04-06 DIAGNOSIS — Z7982 Long term (current) use of aspirin: Secondary | ICD-10-CM | POA: Insufficient documentation

## 2022-04-06 DIAGNOSIS — I70221 Atherosclerosis of native arteries of extremities with rest pain, right leg: Secondary | ICD-10-CM | POA: Insufficient documentation

## 2022-04-06 DIAGNOSIS — Z01812 Encounter for preprocedural laboratory examination: Secondary | ICD-10-CM

## 2022-04-06 DIAGNOSIS — N1832 Chronic kidney disease, stage 3b: Secondary | ICD-10-CM | POA: Insufficient documentation

## 2022-04-06 DIAGNOSIS — E785 Hyperlipidemia, unspecified: Secondary | ICD-10-CM | POA: Insufficient documentation

## 2022-04-06 DIAGNOSIS — Z7902 Long term (current) use of antithrombotics/antiplatelets: Secondary | ICD-10-CM | POA: Diagnosis not present

## 2022-04-06 DIAGNOSIS — F1123 Opioid dependence with withdrawal: Secondary | ICD-10-CM | POA: Diagnosis not present

## 2022-04-06 DIAGNOSIS — F17210 Nicotine dependence, cigarettes, uncomplicated: Secondary | ICD-10-CM | POA: Diagnosis not present

## 2022-04-06 DIAGNOSIS — I10 Essential (primary) hypertension: Secondary | ICD-10-CM | POA: Diagnosis present

## 2022-04-06 DIAGNOSIS — F1193 Opioid use, unspecified with withdrawal: Secondary | ICD-10-CM | POA: Diagnosis present

## 2022-04-06 DIAGNOSIS — N179 Acute kidney failure, unspecified: Secondary | ICD-10-CM | POA: Diagnosis not present

## 2022-04-06 DIAGNOSIS — I70229 Atherosclerosis of native arteries of extremities with rest pain, unspecified extremity: Secondary | ICD-10-CM | POA: Diagnosis not present

## 2022-04-06 DIAGNOSIS — Z720 Tobacco use: Secondary | ICD-10-CM | POA: Diagnosis present

## 2022-04-06 HISTORY — PX: ABDOMINAL AORTOGRAM W/LOWER EXTREMITY: CATH118223

## 2022-04-06 HISTORY — PX: PERIPHERAL VASCULAR ATHERECTOMY: CATH118256

## 2022-04-06 LAB — COMPREHENSIVE METABOLIC PANEL
ALT: 11 U/L (ref 0–44)
AST: 16 U/L (ref 15–41)
Albumin: 3.7 g/dL (ref 3.5–5.0)
Alkaline Phosphatase: 54 U/L (ref 38–126)
Anion gap: 11 (ref 5–15)
BUN: 17 mg/dL (ref 8–23)
CO2: 19 mmol/L — ABNORMAL LOW (ref 22–32)
Calcium: 9.4 mg/dL (ref 8.9–10.3)
Chloride: 111 mmol/L (ref 98–111)
Creatinine, Ser: 1.16 mg/dL — ABNORMAL HIGH (ref 0.44–1.00)
GFR, Estimated: 53 mL/min — ABNORMAL LOW (ref >60)
Glucose, Bld: 116 mg/dL — ABNORMAL HIGH (ref 70–99)
Potassium: 3.8 mmol/L (ref 3.5–5.1)
Sodium: 141 mmol/L (ref 135–145)
Total Bilirubin: 0.9 mg/dL (ref 0.3–1.2)
Total Protein: 6.5 g/dL (ref 6.5–8.1)

## 2022-04-06 LAB — CBC
HCT: 31.3 % — ABNORMAL LOW (ref 36.0–46.0)
Hemoglobin: 10.2 g/dL — ABNORMAL LOW (ref 12.0–15.0)
MCH: 30.5 pg (ref 26.0–34.0)
MCHC: 32.6 g/dL (ref 30.0–36.0)
MCV: 93.7 fL (ref 80.0–100.0)
Platelets: 204 10*3/uL (ref 150–400)
RBC: 3.34 MIL/uL — ABNORMAL LOW (ref 3.87–5.11)
RDW: 13 % (ref 11.5–15.5)
WBC: 10.5 10*3/uL (ref 4.0–10.5)
nRBC: 0 % (ref 0.0–0.2)

## 2022-04-06 LAB — POCT ACTIVATED CLOTTING TIME
Activated Clotting Time: 250 seconds
Activated Clotting Time: 288 seconds
Activated Clotting Time: 239 seconds
Activated Clotting Time: 217 seconds
Activated Clotting Time: 185 seconds

## 2022-04-06 SURGERY — ABDOMINAL AORTOGRAM W/LOWER EXTREMITY
Anesthesia: LOCAL

## 2022-04-06 MED ORDER — LOPERAMIDE HCL 2 MG PO CAPS: 2.0000 mg | ORAL_CAPSULE | ORAL | Status: DC | PRN

## 2022-04-06 MED ORDER — HYDRALAZINE HCL 20 MG/ML IJ SOLN
INTRAMUSCULAR | Status: AC
  Filled 2022-04-06: qty 1

## 2022-04-06 MED ORDER — DICYCLOMINE HCL 20 MG PO TABS: 20.0000 mg | ORAL_TABLET | Freq: Four times a day (QID) | ORAL | Status: DC | PRN

## 2022-04-06 MED ORDER — MIDAZOLAM HCL 2 MG/2ML IJ SOLN
INTRAMUSCULAR | Status: AC
  Filled 2022-04-06: qty 2

## 2022-04-06 MED ORDER — ONDANSETRON HCL 4 MG/2ML IJ SOLN: 4.0000 mg | Freq: Four times a day (QID) | INTRAMUSCULAR | Status: DC | PRN

## 2022-04-06 MED ORDER — FENTANYL CITRATE (PF) 100 MCG/2ML IJ SOLN
INTRAMUSCULAR | Status: DC | PRN
  Administered 2022-04-06 (×3): 25 ug via INTRAVENOUS

## 2022-04-06 MED ORDER — ADULT MULTIVITAMIN W/MINERALS CH
1.0000 | ORAL_TABLET | Freq: Every day | ORAL | Status: DC
  Administered 2022-04-07: 1 via ORAL
  Filled 2022-04-06: qty 1

## 2022-04-06 MED ORDER — SODIUM CHLORIDE 0.9 % IV SOLN: 250.0000 mL | INTRAVENOUS | Status: DC | PRN

## 2022-04-06 MED ORDER — HEPARIN SODIUM (PORCINE) 1000 UNIT/ML IJ SOLN
INTRAMUSCULAR | Status: DC | PRN
  Administered 2022-04-06: 7000 [IU] via INTRAVENOUS
  Administered 2022-04-06: 2500 [IU] via INTRAVENOUS

## 2022-04-06 MED ORDER — SODIUM CHLORIDE 0.9% FLUSH: 3.0000 mL | INTRAVENOUS | Status: DC | PRN

## 2022-04-06 MED ORDER — SODIUM CHLORIDE 0.9 % WEIGHT BASED INFUSION
3.0000 mL/kg/h | INTRAVENOUS | Status: DC
  Administered 2022-04-06: 3 mL/kg/h via INTRAVENOUS

## 2022-04-06 MED ORDER — ATORVASTATIN CALCIUM 80 MG PO TABS
80.0000 mg | ORAL_TABLET | Freq: Every day | ORAL | Status: DC
  Administered 2022-04-06 – 2022-04-07 (×2): 80 mg via ORAL
  Filled 2022-04-06 (×2): qty 1

## 2022-04-06 MED ORDER — LABETALOL HCL 5 MG/ML IV SOLN: 10.0000 mg | INTRAVENOUS | Status: DC | PRN

## 2022-04-06 MED ORDER — SODIUM CHLORIDE 0.9% FLUSH
3.0000 mL | Freq: Two times a day (BID) | INTRAVENOUS | Status: DC
  Administered 2022-04-06 – 2022-04-07 (×2): 3 mL via INTRAVENOUS

## 2022-04-06 MED ORDER — LIDOCAINE HCL (PF) 1 % IJ SOLN
INTRAMUSCULAR | Status: DC | PRN
  Administered 2022-04-06: 30 mL

## 2022-04-06 MED ORDER — ASPIRIN 81 MG PO CHEW: 81.0000 mg | CHEWABLE_TABLET | ORAL | Status: DC

## 2022-04-06 MED ORDER — MIDAZOLAM HCL 2 MG/2ML IJ SOLN
INTRAMUSCULAR | Status: DC | PRN
  Administered 2022-04-06: 1 mg via INTRAVENOUS

## 2022-04-06 MED ORDER — FOLIC ACID 1 MG PO TABS
1.0000 mg | ORAL_TABLET | Freq: Every day | ORAL | Status: DC
  Administered 2022-04-07: 1 mg via ORAL
  Filled 2022-04-06: qty 1

## 2022-04-06 MED ORDER — SODIUM CHLORIDE 0.9% FLUSH
3.0000 mL | Freq: Two times a day (BID) | INTRAVENOUS | Status: DC
  Administered 2022-04-07: 3 mL via INTRAVENOUS

## 2022-04-06 MED ORDER — ASPIRIN 81 MG PO TBEC
81.0000 mg | DELAYED_RELEASE_TABLET | Freq: Every day | ORAL | Status: DC
  Administered 2022-04-07: 81 mg via ORAL
  Filled 2022-04-06: qty 1

## 2022-04-06 MED ORDER — THIAMINE HCL 100 MG/ML IJ SOLN: 100.0000 mg | Freq: Every day | INTRAMUSCULAR | Status: DC

## 2022-04-06 MED ORDER — HYDROXYZINE HCL 25 MG PO TABS: 25.0000 mg | ORAL_TABLET | Freq: Four times a day (QID) | ORAL | Status: DC | PRN

## 2022-04-06 MED ORDER — CLONIDINE HCL 0.1 MG PO TABS: 0.1000 mg | ORAL_TABLET | Freq: Every day | ORAL | Status: DC

## 2022-04-06 MED ORDER — ACETAMINOPHEN 325 MG PO TABS: 650.0000 mg | ORAL_TABLET | ORAL | Status: DC | PRN

## 2022-04-06 MED ORDER — MORPHINE SULFATE (PF) 2 MG/ML IV SOLN
2.0000 mg | INTRAVENOUS | Status: DC | PRN
  Administered 2022-04-06 – 2022-04-07 (×2): 2 mg via INTRAVENOUS
  Filled 2022-04-06: qty 1

## 2022-04-06 MED ORDER — FENTANYL CITRATE (PF) 100 MCG/2ML IJ SOLN
INTRAMUSCULAR | Status: AC
  Filled 2022-04-06: qty 2

## 2022-04-06 MED ORDER — LORAZEPAM 2 MG/ML IJ SOLN: 1.0000 mg | INTRAMUSCULAR | Status: DC | PRN

## 2022-04-06 MED ORDER — THIAMINE MONONITRATE 100 MG PO TABS
100.0000 mg | ORAL_TABLET | Freq: Every day | ORAL | Status: DC
  Administered 2022-04-07: 100 mg via ORAL
  Filled 2022-04-06: qty 1

## 2022-04-06 MED ORDER — SODIUM CHLORIDE 0.9 % IV SOLN: INTRAVENOUS | Status: AC

## 2022-04-06 MED ORDER — SODIUM CHLORIDE 0.9 % WEIGHT BASED INFUSION
1.0000 mL/kg/h | INTRAVENOUS | Status: DC
  Administered 2022-04-06: 1 mL/kg/h via INTRAVENOUS

## 2022-04-06 MED ORDER — HEPARIN (PORCINE) IN NACL 1000-0.9 UT/500ML-% IV SOLN
INTRAVENOUS | Status: DC | PRN
  Administered 2022-04-06 (×2): 500 mL

## 2022-04-06 MED ORDER — MIDAZOLAM HCL 2 MG/2ML IJ SOLN
1.0000 mg | Freq: Once | INTRAMUSCULAR | Status: AC
  Administered 2022-04-06: 1 mg via INTRAVENOUS

## 2022-04-06 MED ORDER — NICOTINE 14 MG/24HR TD PT24
14.0000 mg | MEDICATED_PATCH | Freq: Every day | TRANSDERMAL | Status: DC
  Administered 2022-04-06 – 2022-04-07 (×2): 14 mg via TRANSDERMAL
  Filled 2022-04-06 (×2): qty 1

## 2022-04-06 MED ORDER — HEPARIN (PORCINE) IN NACL 1000-0.9 UT/500ML-% IV SOLN
INTRAVENOUS | Status: AC
  Filled 2022-04-06: qty 1000

## 2022-04-06 MED ORDER — CLOPIDOGREL BISULFATE 75 MG PO TABS
75.0000 mg | ORAL_TABLET | Freq: Every day | ORAL | Status: DC
  Administered 2022-04-07: 75 mg via ORAL
  Filled 2022-04-06: qty 1

## 2022-04-06 MED ORDER — METHOCARBAMOL 500 MG PO TABS: 500.0000 mg | ORAL_TABLET | Freq: Three times a day (TID) | ORAL | Status: DC | PRN

## 2022-04-06 MED ORDER — IODIXANOL 320 MG/ML IV SOLN
INTRAVENOUS | Status: DC | PRN
  Administered 2022-04-06: 105 mL via INTRA_ARTERIAL

## 2022-04-06 MED ORDER — CLONIDINE HCL 0.1 MG PO TABS
0.1000 mg | ORAL_TABLET | Freq: Four times a day (QID) | ORAL | Status: DC
  Administered 2022-04-06 – 2022-04-07 (×3): 0.1 mg via ORAL
  Filled 2022-04-06 (×3): qty 1

## 2022-04-06 MED ORDER — HYDRALAZINE HCL 20 MG/ML IJ SOLN
5.0000 mg | INTRAMUSCULAR | Status: DC | PRN
  Administered 2022-04-06: 5 mg via INTRAVENOUS

## 2022-04-06 MED ORDER — CLOPIDOGREL BISULFATE 300 MG PO TABS
ORAL_TABLET | ORAL | Status: DC | PRN
  Administered 2022-04-06: 300 mg via ORAL

## 2022-04-06 MED ORDER — CLOPIDOGREL BISULFATE 300 MG PO TABS
ORAL_TABLET | ORAL | Status: AC
  Filled 2022-04-06: qty 1

## 2022-04-06 MED ORDER — CLONIDINE HCL 0.1 MG PO TABS: 0.1000 mg | ORAL_TABLET | ORAL | Status: DC

## 2022-04-06 MED ORDER — LIDOCAINE HCL (PF) 1 % IJ SOLN
INTRAMUSCULAR | Status: AC
  Filled 2022-04-06: qty 30

## 2022-04-06 MED ORDER — MORPHINE SULFATE (PF) 2 MG/ML IV SOLN
INTRAVENOUS | Status: AC
  Filled 2022-04-06: qty 1

## 2022-04-06 MED ORDER — HEPARIN SODIUM (PORCINE) 1000 UNIT/ML IJ SOLN
INTRAMUSCULAR | Status: AC
  Filled 2022-04-06: qty 10

## 2022-04-06 MED ORDER — LORAZEPAM 0.5 MG PO TABS: 1.0000 mg | ORAL_TABLET | ORAL | Status: DC | PRN

## 2022-04-06 SURGICAL SUPPLY — 27 items
BAG SNAP BAND KOVER 36X36 (MISCELLANEOUS) IMPLANT
BALLN COYOTE OTW 2.5X120X150 (BALLOONS) ×2
BALLN IN.PACT DCB 5X120 (BALLOONS) ×2
BALLOON COYOTE OTW 2.5X120X150 (BALLOONS) IMPLANT
CATH ANGIO 5F PIGTAIL 65CM (CATHETERS) IMPLANT
CATH CROSS OVER TEMPO 5F (CATHETERS) IMPLANT
CATH HAWKONE LX EXTENDED TIP (CATHETERS) IMPLANT
CATH STRAIGHT 5FR 65CM (CATHETERS) IMPLANT
CATH SYNTRAX .014X150 (CATHETERS) IMPLANT
DCB IN.PACT 5X120 (BALLOONS) IMPLANT
DEVICE SPIDERFX EMB PROT 6MM (WIRE) IMPLANT
GUIDEWIRE ANGLED .035X150CM (WIRE) IMPLANT
KIT ENCORE 26 ADVANTAGE (KITS) IMPLANT
KIT PV (KITS) ×2 IMPLANT
SHEATH HIGHFLEX ANSEL 7FR 55CM (SHEATH) IMPLANT
SHEATH PINNACLE 5F 10CM (SHEATH) IMPLANT
SHEATH PINNACLE 7F 10CM (SHEATH) IMPLANT
SHEATH PROBE COVER 6X72 (BAG) IMPLANT
STOPCOCK MORSE 400PSI 3WAY (MISCELLANEOUS) IMPLANT
SYR MEDRAD MARK 7 150ML (SYRINGE) ×2 IMPLANT
TAPE VIPERTRACK RADIOPAQ (MISCELLANEOUS) IMPLANT
TAPE VIPERTRACK RADIOPAQUE (MISCELLANEOUS) ×2
TRANSDUCER W/STOPCOCK (MISCELLANEOUS) ×2 IMPLANT
TRAY PV CATH (CUSTOM PROCEDURE TRAY) ×2 IMPLANT
TUBING CIL FLEX 10 FLL-RA (TUBING) IMPLANT
WIRE HITORQ VERSACORE ST 145CM (WIRE) IMPLANT
WIRE SHEPHERD 6G .014 (WIRE) IMPLANT

## 2022-04-06 NOTE — Consult Note (Signed)
Initial Consultation Note   Patient: [name] RSW:[id number] DOB: [birth year] PCP: [facility] [place] DOA: 04/06/2022 DOS: the patient was seen and examined on 04/06/2022 Primary service: Lorretta Harp, MD  Referring physician: Gwenlyn Found Reason for consult: Presented today for aortogram, planned to admit overnight.  Became restless with tachycardia, BP elevated post-procedure.  Has been using other people's oxy daily for the last month, ?withdrawal.  Given Versed and ordered UDS.       Assessment and Plan: * Critical lower limb ischemia ([place]) -Patient with RLE ischemic pain -Presented today for angiogram -Underwent successful mid-right SFA directional atherectomy with DCB -Plan was to monitor overnight with hydration with probable dc to home in AM -She will need f/u with cardiology in 1-2 weeks  Opiate withdrawal ([place]) -Patient became quite agitated in the PACU -Upon questioning, she has been taking 10 mg Oxycontin TID for about a month and appears to be in opiate withdrawal -prn orders from the Clonidine withdrawal order set were ordered. -prn morphine for pain should also help with withdrawal -She would benefit from referral to pain clinic as an outpatient so that she can be effectively weaned off medications  AKI (acute kidney injury) ([place]) -Patient was found to have AKI on presentation, in the setting of baseline stage 3b CKD -Her renal function is already improving -Cardiology has ordered IVF overnight -Attempt to avoid nephrotoxic medications -Recheck BMP in AM   Tobacco abuse -Encourage cessation.   -Patch ordered  Essential hypertension -Lisinopril is her only home BP medication and needs to be held in the setting of renal dysfunction -IV metoprolol and hydralazine have been ordered for prn use       TRH will continue to follow the patient.  HPI: [name] is a 61 y.o. female with past medical history of HTN and PVD with critical limb ischemia  presenting for angiogram.  She has been having R foot pain and came in today for angiogram.  Post-procedure, she became quite agitated and there was concern for opiate withdrawal.  On further questioning, she has been taking a friend's oxycontin 10 mg TID for at least a month. She has not had any today.   Review of Systems: As mentioned in the history of present illness. All other systems reviewed and are negative. Past Medical History:  Diagnosis Date   Constipation    Hypertension    Past Surgical History:  Procedure Laterality Date   HERNIA REPAIR     Social History:  reports that she has been smoking cigarettes. She has been smoking an average of 1 pack per day. She has never used smokeless tobacco. She reports that she does not currently use drugs. She reports that she does not drink alcohol.  No Known Allergies  No family history on file.  Prior to Admission medications   Medication Sig Start Date End Date Taking? Authorizing Provider  docusate sodium (COLACE) 100 MG capsule Take 100 mg by mouth daily.   Yes [provider]  ibuprofen (ADVIL) 600 MG tablet Take 1 tablet (600 mg total) by mouth every 6 (six) hours as needed. Patient taking differently: Take 600-800 mg by mouth every 6 (six) hours as needed for mild pain or moderate pain. 0/93/81  Yes Delora Fuel, MD  lisinopril (ZESTRIL) 10 MG tablet Take 10 mg by mouth daily.   Yes [provider]  nicotine polacrilex (NICORETTE) 4 MG gum Take 4 mg by mouth as needed for smoking cessation. 08/05/21  Yes  [provider]  ondansetron (ZOFRAN-ODT) 4 MG disintegrating tablet Take 1 tablet (4 mg total) by mouth every 8 (eight) hours as needed for nausea or vomiting. 03/13/22  Yes Wilnette Kales, [place]    Physical Exam: Vitals:   04/06/22 1113 04/06/22 1118 04/06/22 1123 04/06/22 1144  BP:    (!) 140/77  Pulse: (!) 54 (!) 57 (!) 0 (!) 50  Resp: _0 Temp:      TempSrc:      SpO2:  100%    Weight:       Height:       General:  Appears agitated but redirectable Eyes:   EOMI, normal lids, iris ENT:  grossly normal hearing, lips & tongue, mmm; edentulous Neck:  no LAD, masses or thyromegaly Cardiovascular:  RR with mild bradycardia, no m/r/g. No LE edema.  Respiratory:   CTA bilaterally with no wheezes/rales/rhonchi.  Normal respiratory effort. Abdomen:  soft, NT, ND Skin:  no rash or induration seen on limited exam Musculoskeletal:  grossly normal tone BUE/BLE, good ROM, no bony abnormality Lower extremity:  No LE edema.  Limited foot exam with no ulcerations.  Psychiatric:  agitated mood and affect, speech fluent and appropriate, AOx3 Neurologic:  CN 2-12 grossly intact, moves all extremities in coordinated fashion   Radiological Exams on Admission: Independently reviewed - see discussion in A/P where applicable  PERIPHERAL VASCULAR CATHETERIZATION  Result Date: 04/06/2022 Images from the original result were not included.  [id number] LOCATION:  FACILITY: [place] PHYSICIAN: Quay Burow, M.D. 1960-11-03 DATE OF PROCEDURE:  04/06/2022 DATE OF DISCHARGE: PV Angiogram/Intervention History obtained from chart review. [name] is a 61 y.o. thin-appearing divorced African-American female mother of 3 children who is retired from working at [employer].  She was referred by Dr. Boneta Lucks, podiatrist, for critical limb ischemia.  Risk factors include treated hypertension and ongoing tobacco abuse having smoked 45 pack years and currently smoking one third of a pack a day.  There is no family history for heart disease.  She is never had an attack or stroke.  She denies chest pain or shortness of breath.  She developed pain in her right foot approxi-3 weeks ago and now has some acrocyanosis.  She was seen in the ER for this on 03/30/2022.  Recent Doppler studies performed 03/31/2022 revealed a right ABI of 0.73 and a left of 0.70.  She had a high-frequency signal in her distal right SFA  with occluded right AT and PT as well as an occluded left popliteal artery. Pre Procedure Diagnosis: Critical limb ischemia Post Procedure Diagnosis: Critical limb ischemia Operators: Dr. Quay Burow Procedures Performed:  1.  Ultrasound-guided left common femoral access  2.  Abdominal aortogram  3.  Contralateral access (second-order catheter placement)  4.  Right lower extremity angiography             5.  Spider distal protection device right above the knee popliteal artery             6, Hawk 1 directional atherectomy followed by drug-coated balloon angioplasty mid right SFA             7.  PTA right anterior tibial artery.         PROCEDURE DESCRIPTION: The patient was brought to the second floor [place] [facility] cath lab in the the postabsorptive state. She was premedicated with IV Versed and fentanyl. Her left groin was prepped and shaved in usual sterile  fashion. Xylocaine 1% was used for local anesthesia. A 5 French sheath was inserted into the left common femoral artery using standard Seldinger technique.  Ultrasound was used to identify the left common femoral artery and guide access.  A digital image was captured and placed the patient's chart.  A 5 French pigtail catheter was placed in the distal abdominal aorta.  Distal abdominal aortography was performed.  Contralateral access was obtained with a crossover catheter, Glidewire and 5 French endhole catheter (second-order catheter placement).  Right lower extremity angiography with runoff was performed using bolus chase, digital subtraction and step table technique.  Omnipaque dye was used for the entirety of the case (105 cc of contrast total to patient).  Retrograde ordered pressures monitored to the case. Angiographic Data: 1: Abdominal aorta-widely patent 2: Right lower extremity-99% mid right SFA stenosis, occluded right anterior tibial and posterior tibial arteries.  The peroneal was patent to the ankle and provided collaterals at the at that  level.   Ms. Rua has critical ischemia with resting pain, high-grade mid right SFA and tibial vessel disease.  We will proceed with right SFA Hawk 1 directional atherectomy followed by [facility] using spider distal protection after which we will reassess the anterior tibial artery. Procedure Description: Patient received a total of 9500 units of heparin with an ACT of 288.  Using a 7 French 55 cm multipurpose Ansell sheath across the bifurcation and placed it in the right common femoral artery.  I then crossed the high-grade mid right SFA stenosis with a 01 4/6 g Shepperd wire.  Following this I placed a 6 mm spider distal protection device in the right above-the-knee popliteal artery. I then used a [employer]  directional atherectomy device and perform multiple circumferential cuts of the diseased segment followed by [facility] using a 5 mm x 120 mm long impact Admiral balloon at 4 atm for 2 and half minutes resulting reduction of a 99% mid right SFA stenosis to 0% residual. Following this I gained access to the anterior tibial with a quick cross at 1 4 catheter and the same 1 for Shepperd wire.  I crossed the total occlusion and angioplastied the mid right AT with a 2.5 mm x 120 mm long coyote angioplasty balloon at 4 atm for 2 minutes resulting reduction of a subtotal occlusion to widely patent vessel. Final Impression: Successful mid right SFA directional atherectomy followed by [facility] with spider distal protection in the setting of critical ischemia and rest pain along with right anterior tibial PTA.  Hopefully this will provide improved blood flow and will improve her right foot pain.  She did receive 300 mg of p.o. clopidogrel at the end of the case.  The Ansell sheath was exchanged over an 035 versa core wire for a short 7 [place] sheath which was then secured in place.  The sheath will be removed once ACT falls below 170 pressure held.  Because of her moderate renal insufficiency she will be hydrated for the next 12 hours  and her renal function will be assessed in the morning.  Hopefully she will be discharged home we will get lower extremity arterial Doppler studies in unrefined office next week.  I will see her back the week after.  She left the lab in stable condition. Quay Burow. MD, [facility] 04/06/2022 11:45 AM     EKG: not done   Labs on Admission: I have personally reviewed the available labs and imaging studies at the time of the admission.  Pertinent  labs:    K+ 6.3 -> 5.0 BUN 29 -> 28/Creatinine 2.33 -> 1.87/GFR 23 -> 30; 45/1.49/39 on 12/4 Hgb 9.8 -> 9.5; 14.1 on 12/4 COVID + on 12/4  Family Communication: None present; Dr. Gwenlyn Found spoke with family following the procedure Primary team communication: Patient was discussed with cardiology PA at the time of the consult request.  Thank you very much for involving [place] in the care of your patient.  Author: Karmen Bongo, MD 04/06/2022 6:19 PM  For on call review [email or web address].

## 2022-04-06 NOTE — Assessment & Plan Note (Signed)
-  Encourage cessation.   -Patch ordered  

## 2022-04-06 NOTE — Assessment & Plan Note (Signed)
-  Lisinopril is her only home BP medication and needs to be held in the setting of renal dysfunction -IV metoprolol and hydralazine have been ordered for prn use

## 2022-04-06 NOTE — Assessment & Plan Note (Signed)
-  Patient was found to have AKI on presentation, in the setting of baseline stage 3b CKD -Her renal function is already improving -Cardiology has ordered IVF overnight -Attempt to avoid nephrotoxic medications -Recheck BMP in AM

## 2022-04-06 NOTE — Assessment & Plan Note (Signed)
-  Patient with RLE ischemic pain -Presented today for angiogram -Underwent successful mid-right SFA directional atherectomy with DCB -Plan was to monitor overnight with hydration with probable dc to home in AM -She will need f/u with cardiology in 1-2 weeks

## 2022-04-06 NOTE — Assessment & Plan Note (Signed)
-  Patient became quite agitated in the PACU -Upon questioning, she has been taking 10 mg Oxycontin TID for about a month and appears to be in opiate withdrawal -prn orders from the Clonidine withdrawal order set were ordered. -prn morphine for pain should also help with withdrawal -She would benefit from referral to pain clinic as an outpatient so that she can be effectively weaned off medications

## 2022-04-06 NOTE — Progress Notes (Signed)
    Notified by cath lab holding staff that after patient's PV procedure earlier today, she woke up and became restless in the bed. BP was elevated with SBP ranging from 140s-170. HR became elevated to 100s-110s. Patient denied chest pain, groin pain, pain in lower back. She reported feeling "off" and "funny." Noted to become diaphoretic and continued to move her arms and legs in the bed. Because of this movement, patient developed a hematoma around left femoral access site. RN at bedside applied pressure.   Patient reported to smoking 1 pack of cigarettes daily. Ordered and applied nicotine patch. She initially denied use of other drugs, but eventually admitted that she had been taking a friend's oxycontin for the past month. Admits to daily use, but is unsure how many pills she has been taking daily. Patient was given 1 mg IV versed. Agitation seemed to improve, HR decreased to the 80s. BP remained elevated with SBP in the 150s. I ordered a urine drug screen and consulted triad hospitalists for assistance in managing suspected opioid withdrawal. Also ordered CMP, CBC.   Jonita Albee, PA-C 04/06/2022 3:01 PM

## 2022-04-06 NOTE — Interval H&P Note (Signed)
History and Physical Interval Note:  04/06/2022 9:31 AM  [name]  has presented today for surgery, with the diagnosis of peripheral vascular disease.  The various methods of treatment have been discussed with the patient and family. After consideration of risks, benefits and other options for treatment, the patient has consented to  Procedure(s): ABDOMINAL AORTOGRAM W/LOWER EXTREMITY (N/A) as a surgical intervention.  The patient's history has been reviewed, patient examined, no change in status, stable for surgery.  I have reviewed the patient's chart and labs.  Questions were answered to the patient's satisfaction.     Nanetta Batty

## 2022-04-06 NOTE — Plan of Care (Signed)
  Problem: Activity: Goal: Ability to return to baseline activity level will improve Outcome: Progressing   Problem: Cardiovascular: Goal: Ability to achieve and maintain adequate cardiovascular perfusion will improve Outcome: Progressing   Problem: Health Behavior/Discharge Planning: Goal: Ability to manage health-related needs will improve Outcome: Progressing

## 2022-04-07 ENCOUNTER — Other Ambulatory Visit (HOSPITAL_COMMUNITY): Payer: Self-pay

## 2022-04-07 ENCOUNTER — Encounter (HOSPITAL_COMMUNITY): Payer: Self-pay | Admitting: Cardiovascular Disease

## 2022-04-07 DIAGNOSIS — I70221 Atherosclerosis of native arteries of extremities with rest pain, right leg: Secondary | ICD-10-CM | POA: Diagnosis not present

## 2022-04-07 DIAGNOSIS — I70229 Atherosclerosis of native arteries of extremities with rest pain, unspecified extremity: Secondary | ICD-10-CM | POA: Diagnosis not present

## 2022-04-07 LAB — BASIC METABOLIC PANEL
Anion gap: 10 (ref 5–15)
BUN: 16 mg/dL (ref 8–23)
CO2: 21 mmol/L — ABNORMAL LOW (ref 22–32)
Calcium: 9.6 mg/dL (ref 8.9–10.3)
Chloride: 113 mmol/L — ABNORMAL HIGH (ref 98–111)
Creatinine, Ser: 1.09 mg/dL — ABNORMAL HIGH (ref 0.44–1.00)
GFR, Estimated: 57 mL/min — ABNORMAL LOW (ref >60)
Glucose, Bld: 111 mg/dL — ABNORMAL HIGH (ref 70–99)
Potassium: 5.6 mmol/L — ABNORMAL HIGH (ref 3.5–5.1)
Sodium: 144 mmol/L (ref 135–145)

## 2022-04-07 LAB — CBC
HCT: 28.1 % — ABNORMAL LOW (ref 36.0–46.0)
Hemoglobin: 9.3 g/dL — ABNORMAL LOW (ref 12.0–15.0)
MCH: 31.1 pg (ref 26.0–34.0)
MCHC: 33.1 g/dL (ref 30.0–36.0)
MCV: 94 fL (ref 80.0–100.0)
Platelets: 198 10*3/uL (ref 150–400)
RBC: 2.99 MIL/uL — ABNORMAL LOW (ref 3.87–5.11)
RDW: 13.2 % (ref 11.5–15.5)
WBC: 8.8 10*3/uL (ref 4.0–10.5)
nRBC: 0 % (ref 0.0–0.2)

## 2022-04-07 LAB — RAPID URINE DRUG SCREEN, HOSP PERFORMED
Amphetamines: NOT DETECTED
Barbiturates: NOT DETECTED
Benzodiazepines: POSITIVE — AB
Cocaine: NOT DETECTED
Opiates: POSITIVE — AB
Tetrahydrocannabinol: NOT DETECTED

## 2022-04-07 LAB — LIPID PANEL
Cholesterol: 189 mg/dL (ref 0–200)
HDL: 51 mg/dL (ref >40)
LDL Cholesterol: 128 mg/dL — ABNORMAL HIGH (ref 0–99)
Total CHOL/HDL Ratio: 3.7 RATIO
Triglycerides: 51 mg/dL (ref <150)
VLDL: 10 mg/dL (ref 0–40)

## 2022-04-07 LAB — POTASSIUM: Potassium: 4.6 mmol/L (ref 3.5–5.1)

## 2022-04-07 MED ORDER — CLOPIDOGREL BISULFATE 75 MG PO TABS
75.0000 mg | ORAL_TABLET | Freq: Every day | ORAL | 3 refills | Status: DC
  Filled 2022-04-07: qty 30, 30d supply, fill #0

## 2022-04-07 MED ORDER — ATORVASTATIN CALCIUM 80 MG PO TABS
80.0000 mg | ORAL_TABLET | Freq: Every day | ORAL | 3 refills | Status: DC
  Filled 2022-04-07: qty 30, 30d supply, fill #0

## 2022-04-07 MED ORDER — ASPIRIN 81 MG PO TBEC
81.0000 mg | DELAYED_RELEASE_TABLET | Freq: Every day | ORAL | 12 refills | Status: DC
  Filled 2022-04-07: qty 30, 30d supply, fill #0

## 2022-04-07 MED ORDER — AMLODIPINE BESYLATE 5 MG PO TABS
5.0000 mg | ORAL_TABLET | Freq: Every day | ORAL | 3 refills | Status: DC
  Filled 2022-04-07: qty 30, 30d supply, fill #0

## 2022-04-07 MED ORDER — ORAL CARE MOUTH RINSE: 15.0000 mL | OROMUCOSAL | Status: DC | PRN

## 2022-04-07 MED ORDER — NICOTINE 14 MG/24HR TD PT24
14.0000 mg | MEDICATED_PATCH | Freq: Every day | TRANSDERMAL | 0 refills | Status: AC
  Filled 2022-04-07: qty 28, 28d supply, fill #0

## 2022-04-07 MED FILL — Midazolam HCl Inj 2 MG/2ML (Base Equivalent): INTRAMUSCULAR | Qty: 2 | Status: AC

## 2022-04-07 NOTE — Progress Notes (Signed)
Patient discharging today. Discussed with Dr. Clifton James, no need for hospitalists to see today. Will sign off. Please let me know if any questions or concerns from discharge standpoint or new recs needed.

## 2022-04-07 NOTE — Discharge Instructions (Signed)
                   Outpatient Substance Abuse  Treatment- uninsured  Narcotics Anonymous 24-HOUR HELPLINE Pre-recorded for Meeting Schedules [facility] [phone]  [email or web address] ALCOHOLICS ANONYMOUS  [place]  Answering Service [phone] Please Note: All [place] Meetings are Non-smoking [email or web address]  [facility] -  Insurance: Medicaid /State funding/private insurance Methadone, suboxone/Intensive outpatient  [place] [phone] Fax: [phone] [address], [place], [place], [address] [place] [phone] Fax: [phone]    [address], [place], [place], [address] (Serves , [place], [place], [place], [place], [place], [place], [place]) [facility] [email or web address]/ Accepts State funding/Medicaid Transitional housing, Intensive Outpatient Treatment, Outpatient treatment, Veterans Services  Phone: [phone] Fax: [phone] Address: [address], [place] [address]   [facility] (http://[email or web address]info/) Insurance: Medicaid Case Management, Community Support Team, Medication Management, Outpatient Therapy, Psychosocial Rehabilitation, Substance Abuse Intensive Outpatient  Phone: [phone] Fax: [phone] [address], [place], [place], [address]  [facility] Medicaid, most private insurance providers Types of Program: Individual/Group Therapy, Substance Abuse Treatment  Phone: [place] [phone] Fax: [phone] [address], [place], [place], [address] [place] [phone] [address], [place], [place], [address]  [facility]  Medicaid Types of Program: SAIOP  Phone: [phone] Fax: [phone] [address], [place], [place], [address] [facility] Medicaid/state funds Crisis line [phone] [place]-[address] [phone] [place] -[address] [phone]  [facility] [address];  [place], [place] [address] [phone]  Substance Abuse Intensive Outpatient Program [facility] [address] [place], [place] [address] [phone]- Substance abuse treatment  Successful Transitions  Insurance: [place] Medicaid, BCBS, sliding scale Types of Program: substance abuse treatment, transportation assistance Phone: [phone] Fax: [phone] Address: [address], [place] [address] [facility] ([email or web address]/) Insurance: UHC, BCBS, Aetna, Medicaid of [place] Program: addiction counseling, detoxification,  Phone: [phone]  Fax: [phone] Address: [address], [place] [address]  [facility] (statewide facilities/programs) [address] (Medicaid/state funds) [place], [place] [address]                      [facility] ([phone] [place]- [phone] [place]- [phone] [facility] (2 Locations) (Medicaid/state funds) --[address]  walk in 8:30-12 and 1-2:30 [place], [id number]   PH- [phone] --[address] [place], [place] [address]  [phone] walk in 8:30-12 and 2-3:30  [facility] state funds/medicaid [address]  [place], [place] [address] [phone] [facility] (Suboxone clinic) Medicaid/state funds  [address]  [place], [place] [address] [phone]   [facility]  [place]-[address], [place], [place] [address]  [phone] (24 hours) [place]- [address] [place], [place] [address]  [phone] (24 hours) [place]- [address] [place] [phone] [place]- [address]  [phone] [place]- [address] [place] [address] [place] [phone] [facility]- Medicaid and state funds  [place]- [address] [place], [place] [address] [phone] (24 hours) [place]- [address] [place], [place]  [address] [phone] [place]- [address] [place], [place] [address] [phone] (24 hours) [place] [address] [place],   [address] [phone] [place]- [address] [place] [phone]    

## 2022-04-07 NOTE — TOC Transition Note (Signed)
Transition of Care [facility]) - CM/SW Discharge Note   Patient Details  Name: [name] MRN: [id number] Date of Birth: Aug 24, 1960  Transition of Care [facility]) CM/SW Contact:  Leone Haven, RN Phone Number: 04/07/2022, 1:01 PM   Clinical Narrative:    Patient is from home, for dc today, she goes to the [facility], [place] contacted April with [facility] , she states CSW are [employer] [phone] ext [phone], Griffin Dakin ext [phone], Frederich Balding ext [phone], Kristopher Glee ext [phone]. Patient states she has transport home today, [facility].          Patient Goals and CMS Choice      Discharge Placement                         Discharge Plan and Services Additional resources added to the After Visit Summary for                                       Social Determinants of Health (SDOH) Interventions SDOH Screenings   Tobacco Use: High Risk (04/07/2022)     Readmission Risk Interventions     No data to display

## 2022-04-07 NOTE — Discharge Summary (Addendum)
Discharge Summary    Patient ID: ANALEESE ANDREATTA MRN: [id number]; DOB: 1961/03/09  Admit date: 04/06/2022 Discharge date: 04/07/2022  PCP:  [facility] [place] [facility] Cardiologist:  Quay Burow, MD     Discharge Diagnoses    Principal Problem:   Critical lower limb ischemia [facility]) Active Problems:   Essential hypertension   Tobacco abuse   Opiate withdrawal ([place])   AKI (acute kidney injury) ([place])    Diagnostic Studies/Procedures    Abdominal Aortogram with lower extremity, Peripheral vascular atherectomy 04/06/22 Angiographic Data:  1: Abdominal aorta-widely patent 2: Right lower extremity-99% mid right SFA stenosis, occluded right anterior tibial and posterior tibial arteries.  The peroneal was patent to the ankle and provided collaterals at the at that level.   IMPRESSION: Ms. Orellana has critical ischemia with resting pain, high-grade mid right SFA and tibial vessel disease.  We will proceed with right SFA Hawk 1 directional atherectomy followed by [facility] using spider distal protection after which we will reassess the anterior tibial artery.  Final Impression: Successful mid right SFA directional atherectomy followed by [facility] with spider distal protection in the setting of critical ischemia and rest pain along with right anterior tibial PTA.  Hopefully this will provide improved blood flow and will improve her right foot pain.  She did receive 300 mg of p.o. clopidogrel at the end of the case.  The Ansell sheath was exchanged over an 035 versa core wire for a short 7 [place] sheath which was then secured in place.  The sheath will be removed once ACT falls below 170 pressure held.  Because of her moderate renal insufficiency she will be hydrated for the next 12 hours and her renal function will be assessed in the morning.  Hopefully she will be discharged home we will get lower extremity arterial Doppler studies in unrefined office next week.  I will  see her back the week after.  She left the lab in stable condition.   _____________   History of Present Illness     Hatsumi Steinhart Marin is a 61 y.o. female with a past medical history of critical limb ischemia, tobacco abuse, and htn. Patient was referred to Dr. Gwenlyn Found and seen on 12/26 for evaluation of critical limb ischemia. Patient's risk factors included hypertension and ongoing tobacco abuse.  Patient reported having smoked for 45 years and currently smoking 1 pack/day.  Patient denied family history of heart disease.  Denied history of heart attack or stroke.  Denied chest pain, shortness of breath.  Patient had developed pain in her right foot approximately 3 weeks prior to presentation.  She was seen in the ER on 03/30/2022 complaining of foot pain.  Underwent Doppler studies on 03/31/2022 that revealed a right ABI of 0.73 and a left of 0.70.  Patient had high-frequency signal in her distal right SFA with occluded right AT and PT as well as an occluded left popliteal artery.  Patient was scheduled for outpatient endovascular therapy.  Hospital Course     Consultants: [facility]   Critical Limb Ischemia  Patient presented on 12/28 for scheduled outpatient endovascular therapy with Dr. Gwenlyn Found. She was taken to the lab, and was found to have critical ischemia with resting pain, high-grade mid right SFA and tibial vessel disease. She underwent successful mid right SFA directional atherectomy followed by [facility] with spider distal protection.  After the case, patient was loaded with 300 mg colpidogrel. Started on daily ASA 81  mg, plavix 75 mg  Because of patient's moderate renal insufficiency, she was admitted overnight for observation and IV hydration. On 12/29, creatinine had improved to 1.09 (improved from 2.33 on 12/26) After the procedure, patient became very agitated and moved her legs around in the bed causing a hematoma to form near the left femoral access site. Pressure was applied. On  12/29, site was stable, only mildly tender to palpation.  Patient has lower extremity arterial doppler studies scheduled for 1/4. Also has a follow up appointment with Dr. Gwenlyn Found on 1/17 Continue ASA, plavix, lipitor at discharge   Opiate Withdrawal  After the procedure, patient became agitated while in the holding area. She was hypertensive, tachycardic, and very restless in the bed After questioning, patient reported that she had been taking her friend's oxycontin 10 mg TID for the past month to manage her foot pain. I counseled patient extensively on the dangers of opioid misuse and taking other people's medications. Explained to her that because the medications are not prescribed to her and she is not followed by a provider to ensure proper dosing/administration, opioids could cause her serious bodily harm or death. Patient voiced understanding and told me she will not be using the concontin anymore  Consulted [facility] who recommended PRN clonidine, morphine while admitted  Patient was referred to pain clinic as an outpatient for further treatment   Tobacco abuse Patient was counseled on the importance of tobacco cessation Given nicotine patches while admitted and prescribed at discharge  HLD  Lipid panel this admission showed total cholesterol 189, HDL 51, LDL 128 Started lipitor 80 mg daily this admission  Needs LFTs and lipid panel in 2-3 months   HTN  PTA, patient was on lisinopril 10 mg daily. Has moderate renal insufficiency, and had K of 6.3 on 12/26.  Stop lisinopril  Start amlodipine 5 mg daily at discharge.   Did the patient have an acute coronary syndrome (MI, NSTEMI, STEMI, etc) this admission?:  No                               Did the patient have a percutaneous coronary intervention (stent / angioplasty)?:  No.       Patient was seen and examined by Dr. Julianne Handler and deemed stable for DC   Patient has a follow up appointment with Dr. Gwenlyn Found on 1/17     _____________  Discharge Vitals Blood pressure (!) 149/97, pulse 74, temperature 98.7 F (37.1 C), temperature source Oral, resp. rate 15, height _0  (1.651 m), weight 64 kg, SpO2 100 %.  Filed Weights   04/06/22 0553  Weight: 64 kg    Labs & Radiologic Studies    CBC Recent Labs    04/06/22 1644 04/07/22 0440  WBC 10.5 8.8  HGB 10.2* 9.3*  HCT 31.3* 28.1*  MCV 93.7 94.0  PLT 204 030   Basic Metabolic Panel Recent Labs    04/06/22 1644 04/07/22 0440 04/07/22 0754  NA 141 144  --   K 3.8 5.6* 4.6  CL 111 113*  --   CO2 19* 21*  --   GLUCOSE 116* 111*  --   BUN 17 16  --   CREATININE 1.16* 1.09*  --   CALCIUM 9.4 9.6  --    Liver Function Tests Recent Labs    04/06/22 1644  AST 16  ALT 11  ALKPHOS 54  BILITOT 0.9  PROT 6.5  ALBUMIN 3.7   No results for input(s): "LIPASE", "AMYLASE" in the last 72 hours. High Sensitivity Troponin:   No results for input(s): "TROPONINIHS" in the last 720 hours.  BNP Invalid input(s): "POCBNP" D-Dimer No results for input(s): "DDIMER" in the last 72 hours. Hemoglobin A1C No results for input(s): "HGBA1C" in the last 72 hours. Fasting Lipid Panel Recent Labs    04/07/22 0440  CHOL 189  HDL 51  LDLCALC 128*  TRIG 51  CHOLHDL 3.7   Thyroid Function Tests No results for input(s): "TSH", "T4TOTAL", "T3FREE", "THYROIDAB" in the last 72 hours.  Invalid input(s): "FREET3" _____________  PERIPHERAL VASCULAR CATHETERIZATION  Result Date: 04/06/2022 Images from the original result were not included.  [id number] LOCATION:  FACILITY: [facility] PHYSICIAN: Quay Burow, M.D. 13-Apr-1960 DATE OF PROCEDURE:  04/06/2022 DATE OF DISCHARGE: PV Angiogram/Intervention History obtained from chart review. JADAE STEINKE is a 61 y.o. thin-appearing divorced African-American female mother of 3 children who is retired from working at [employer].  She was referred by Dr. Boneta Lucks, podiatrist, for critical limb ischemia.  Risk factors  include treated hypertension and ongoing tobacco abuse having smoked 45 pack years and currently smoking one third of a pack a day.  There is no family history for heart disease.  She is never had an attack or stroke.  She denies chest pain or shortness of breath.  She developed pain in her right foot approxi-3 weeks ago and now has some acrocyanosis.  She was seen in the ER for this on 03/30/2022.  Recent Doppler studies performed 03/31/2022 revealed a right ABI of 0.73 and a left of 0.70.  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 Pre Procedure Diagnosis: Critical limb ischemia Post Procedure Diagnosis: Critical limb ischemia Operators: Dr. Quay Burow Procedures Performed:  1.  Ultrasound-guided left common femoral access  2.  Abdominal aortogram  3.  Contralateral access (second-order catheter placement)  4.  Right lower extremity angiography             5.  Spider distal protection device right above the knee popliteal artery             6, Hawk 1 directional atherectomy followed by drug-coated balloon angioplasty mid right SFA             7.  PTA right anterior tibial artery.         PROCEDURE DESCRIPTION: The patient was brought to the second floor [facility] lab in the the postabsorptive state. She was premedicated with IV Versed and fentanyl. Her left groin was prepped and shaved in usual sterile fashion. Xylocaine 1% was used for local anesthesia. A 5 French sheath was inserted into the left common femoral artery using standard Seldinger technique.  Ultrasound was used to identify the left common femoral artery and guide access.  A digital image was captured and placed the patient's chart.  A 5 French pigtail catheter was placed in the distal abdominal aorta.  Distal abdominal aortography was performed.  Contralateral access was obtained with a crossover catheter, Glidewire and 5 French endhole  catheter (second-order catheter placement).  Right lower extremity angiography with runoff was performed using bolus chase, digital subtraction and step table technique.  Omnipaque dye was used for the entirety of the case (105 cc of contrast total to patient).  Retrograde ordered pressures monitored to the case. Angiographic Data:  1: Abdominal aorta-widely patent 2: Right lower extremity-99% mid right SFA stenosis, occluded right anterior tibial and posterior tibial arteries.  The peroneal was patent to the ankle and provided collaterals at the at that level.   Ms. Abraha has critical ischemia with resting pain, high-grade mid right SFA and tibial vessel disease.  We will proceed with right SFA Hawk 1 directional atherectomy followed by [facility] using spider distal protection after which we will reassess the anterior tibial artery. Procedure Description: Patient received a total of 9500 units of heparin with an ACT of 288.  Using a 7 French 55 cm multipurpose Ansell sheath across the bifurcation and placed it in the right common femoral artery.  I then crossed the high-grade mid right SFA stenosis with a 01 4/6 g Shepperd wire.  Following this I placed a 6 mm spider distal protection device in the right above-the-knee popliteal artery. I then used a [employer]  directional atherectomy device and perform multiple circumferential cuts of the diseased segment followed by [facility] using a 5 mm x 120 mm long impact Admiral balloon at 4 atm for 2 and half minutes resulting reduction of a 99% mid right SFA stenosis to 0% residual. Following this I gained access to the anterior tibial with a quick cross at 1 4 catheter and the same 1 for Shepperd wire.  I crossed the total occlusion and angioplastied the mid right AT with a 2.5 mm x 120 mm long coyote angioplasty balloon at 4 atm for 2 minutes resulting reduction of a subtotal occlusion to widely patent vessel. Final Impression: Successful mid right SFA directional atherectomy  followed by [facility] with spider distal protection in the setting of critical ischemia and rest pain along with right anterior tibial PTA.  Hopefully this will provide improved blood flow and will improve her right foot pain.  She did receive 300 mg of p.o. clopidogrel at the end of the case.  The Ansell sheath was exchanged over an 035 versa core wire for a short 7 [place] sheath which was then secured in place.  The sheath will be removed once ACT falls below 170 pressure held.  Because of her moderate renal insufficiency she will be hydrated for the next 12 hours and her renal function will be assessed in the morning.  Hopefully she will be discharged home we will get lower extremity arterial Doppler studies in unrefined office next week.  I will see her back the week after.  She left the lab in stable condition. Quay Burow. MD, [facility] 04/06/2022 11:45 AM    VAS [place] LOWER EXTREMITY ARTERIAL DUPLEX  Result Date: 03/31/2022 LOWER EXTREMITY ARTERIAL DUPLEX STUDY Patient Name:  PEARSON REASONS  Date of Exam:   03/31/2022 Medical Rec #: [id number]       Accession #:    [id number] Date of Birth: [birth year]       Patient Gender: F Patient Age:   61 years Exam Location:  [facility] Procedure:      VAS [place] LOWER EXTREMITY ARTERIAL DUPLEX Referring Phys: Lennette Bihari PATEL --------------------------------------------------------------------------------  Indications: Peripheral artery disease, and patient reports acute onset of              intense pain in the right 1st and 5th toes x 3 weeks. Today, she              c/o that the right 4th toe is becoming painful within the past week  along with purplish discoloration to the 1st, 4th and 5th toes. She              is unable to rest at night, as well as, during the exam. She is              having to constantly move the leg and foot for comfort. She denies              any clinical claudication symptoms. High Risk Factors: Current smoker.  Current ABI: .73 on the right and  .70 on the left Comparison Study: NA Performing Technologist: Sharlett Iles RVT  Examination Guidelines: A complete evaluation includes B-mode imaging, spectral Doppler, color Doppler, and power Doppler as needed of all accessible portions of each vessel. Bilateral testing is considered an integral part of a complete examination. Limited examinations for reoccurring indications may be performed as noted.  +-----------+--------+-----+---------------+----------------+------------------+ RIGHT      PSV cm/sRatioStenosis       Waveform        Comments           +-----------+--------+-----+---------------+----------------+------------------+ CFA Prox   211          50-74% stenosistriphasic       dampened           +-----------+--------+-----+---------------+----------------+------------------+ CFA Mid    160                         multiphasic                        +-----------+--------+-----+---------------+----------------+------------------+ CFA Distal 145                         multiphasic                        +-----------+--------+-----+---------------+----------------+------------------+ DFA        287                         nearly triphasicturbulent          +-----------+--------+-----+---------------+----------------+------------------+ SFA Prox   139                         monophasic      turbulent          +-----------+--------+-----+---------------+----------------+------------------+ SFA Mid    132                         monophasic                         +-----------+--------+-----+---------------+----------------+------------------+ SFA Distal 503     6.4  75-99% stenosismonophasic      mid/distal segment +-----------+--------+-----+---------------+----------------+------------------+ POP Prox   89                          monophasic                         +-----------+--------+-----+---------------+----------------+------------------+  POP Mid    76                          monophasic                         +-----------+--------+-----+---------------+----------------+------------------+  POP Distal 70                          monophasic                         +-----------+--------+-----+---------------+----------------+------------------+ TP Trunk   71                          monophasic                         +-----------+--------+-----+---------------+----------------+------------------+ ATA Prox   56                          monophasic                         +-----------+--------+-----+---------------+----------------+------------------+ ATA Mid    0            occluded                                          +-----------+--------+-----+---------------+----------------+------------------+ ATA Distal 38                          monophasic                         +-----------+--------+-----+---------------+----------------+------------------+ PTA Prox   68                          monophasic                         +-----------+--------+-----+---------------+----------------+------------------+ PTA Mid    0            occluded                       mid/distal segment +-----------+--------+-----+---------------+----------------+------------------+ PTA Distal 38                          monophasic                         +-----------+--------+-----+---------------+----------------+------------------+ PERO Prox  47                          monophasic                         +-----------+--------+-----+---------------+----------------+------------------+ PERO Mid   72                          monophasic                         +-----------+--------+-----+---------------+----------------+------------------+ PERO Distal58                          monophasic                         +-----------+--------+-----+---------------+----------------+------------------+ A  focal  velocity elevation of 502 cm/s was obtained at mid/distal SFA with a VR of 6.4. Findings are characteristic of 75-99% stenosis.  +-----------+--------+-----+---------------+----------------+------------------+ LEFT       PSV cm/sRatioStenosis       Waveform        Comments           +-----------+--------+-----+---------------+----------------+------------------+ CFA Prox   252          50-74% stenosistriphasic                          +-----------+--------+-----+---------------+----------------+------------------+ CFA Mid    237          50-74% stenosistriphasic       turbulent          +-----------+--------+-----+---------------+----------------+------------------+ CFA Distal 146                         triphasic                          +-----------+--------+-----+---------------+----------------+------------------+ DFA        189                         triphasic       turbulent          +-----------+--------+-----+---------------+----------------+------------------+ SFA Prox   132                         nearly triphasicturbulent          +-----------+--------+-----+---------------+----------------+------------------+ SFA Mid    113                         triphasic       turbulent          +-----------+--------+-----+---------------+----------------+------------------+ SFA Distal 218     5.9  75-99% stenosismonophasic      mid/distal                                                                segment, stenosis                                                         based on VR 5.9    +-----------+--------+-----+---------------+----------------+------------------+ POP Prox   0            occluded                       distal SFA/AK                                                             popliteal artery  segment             +-----------+--------+-----+---------------+----------------+------------------+ POP Mid    28                          monophasic                         +-----------+--------+-----+---------------+----------------+------------------+ POP Distal 43                          monophasic                         +-----------+--------+-----+---------------+----------------+------------------+ TP Trunk   51                          monophasic                         +-----------+--------+-----+---------------+----------------+------------------+ ATA Prox   36                          monophasic                         +-----------+--------+-----+---------------+----------------+------------------+ ATA Mid    25                          monophasic                         +-----------+--------+-----+---------------+----------------+------------------+ ATA Distal 25                          monophasic                         +-----------+--------+-----+---------------+----------------+------------------+ PTA Prox   27                          monophasic                         +-----------+--------+-----+---------------+----------------+------------------+ PTA Mid    26                          monophasic                         +-----------+--------+-----+---------------+----------------+------------------+ PTA Distal 22                          monophasic                         +-----------+--------+-----+---------------+----------------+------------------+ PERO Prox  37                          monophasic                         +-----------+--------+-----+---------------+----------------+------------------+ PERO Mid   0            occluded                                          +-----------+--------+-----+---------------+----------------+------------------+  PERO Distal0            occluded                                           +-----------+--------+-----+---------------+----------------+------------------+ A focal velocity elevation of 218 cm/s was obtained at mid/ distal SFA with post stenotic turbulence with a VR of 5.9. Findings are characteristic of 75-99% stenosis.  Summary: Right: Heterogeneous plaque throughout. stent. 50-74% stenosis in the proximal CFA. 75-99% stenosis in the mid/distal SFA. One vessel run-off via peroneal artery. The right pedal acceleration time is 42 ms, which is category 3, indicating moderate ischemia. Left: Heterogeneous plaque throughout. 50-74% stenosis in the proximal and mid CFA. 75-99% stenosis in the mid/distal SFA, based on VR 5.9. Short segment occlusion in the distal SFA/AK popliteal artery. Two vessel run-off via PTA and ATA. The left pedal acceleration time is 96 ms, which is category 3, indicating moderate ischemia.  See table(s) above for measurements and observations. See ABI report. Suggest Peripheral Vascular Consult. Scheduled to see Dr. Gwenlyn Found on 04/04/2022. Electronically signed by Kathlyn Sacramento MD on 03/31/2022 at 3:31:06 PM.    Final    VAS [place] LE ART SEG MULTI (Segm&LE Reynauds)  Result Date: 03/31/2022  LOWER EXTREMITY DOPPLER STUDY Patient Name:  JULIE-ANN VANMAANEN  Date of Exam:   03/31/2022 Medical Rec #: [id number]       Accession #:    [id number] Date of Birth: [birth year]       Patient Gender: F Patient Age:   77 years Exam Location:  [facility] Procedure:      VAS [place] LOWER EXT ART SEG MULTI (SEGMENTALS & LE RAYNAUDS) Referring Phys: Lennette Bihari PATEL --------------------------------------------------------------------------------  Indications: Rest pain. Patient reports acute onset of intense pain in the right              1st and 5th toes x 3 weeks. Today, she c/o that the right 4th toe              is becoming painful within the past week along with purplish              discoloration to the 1st, 4th and 5th toes. She is unable to rest              at night, as well as, during the  exam. She is having to constantly              move the leg and foot for comfort. She denies any clinical              claudication symptoms. High Risk Factors: Current smoker.  Comparison Study: NA Performing Technologist: Sharlett Iles RVT  Examination Guidelines: A complete evaluation includes at minimum, Doppler waveform signals and systolic blood pressure reading at the level of bilateral brachial, anterior tibial, and posterior tibial arteries, when vessel segments are accessible. Bilateral testing is considered an integral part of a complete examination. Photoelectric Plethysmograph (PPG) waveforms and toe systolic pressure readings are included as required and additional duplex testing as needed. Limited examinations for reoccurring indications may be performed as noted.  ABI Findings: +---------+------------------+-----+-----------+--------+ Right    Rt Pressure (mmHg)IndexWaveform   Comment  +---------+------------------+-----+-----------+--------+ Brachial 132                                        +---------+------------------+-----+-----------+--------+  CFA                             multiphasic         +---------+------------------+-----+-----------+--------+ Popliteal                       monophasic          +---------+------------------+-----+-----------+--------+ PTA      89                0.67 monophasic          +---------+------------------+-----+-----------+--------+ PERO     96                0.73 monophasic          +---------+------------------+-----+-----------+--------+ DP       73                0.55 monophasic          +---------+------------------+-----+-----------+--------+ Great Toe0                 0.00 Absent              +---------+------------------+-----+-----------+--------+ +---------+------------------+-----+----------+-------+ Left     Lt Pressure (mmHg)IndexWaveform  Comment  +---------+------------------+-----+----------+-------+ Brachial 124                                      +---------+------------------+-----+----------+-------+ PTA      93                0.70 monophasic        +---------+------------------+-----+----------+-------+ PERO     83                0.63 monophasic        +---------+------------------+-----+----------+-------+ DP       72                0.55 monophasic        +---------+------------------+-----+----------+-------+ Great Toe47                0.36 Abnormal          +---------+------------------+-----+----------+-------+ +-------+-----------+-----------+------------+------------+ ABI/TBIToday's ABIToday's TBIPrevious ABIPrevious TBI +-------+-----------+-----------+------------+------------+ Right  .73        0.0                                 +-------+-----------+-----------+------------+------------+ Left   .70        .36                                 +-------+-----------+-----------+------------+------------+  TOES Findings: +----------+---------------+--------+-------+ Right ToesPressure (mmHg)WaveformComment +----------+---------------+--------+-------+ 1st Digit                Absent          +----------+---------------+--------+-------+ 2nd Digit                Absent          +----------+---------------+--------+-------+ 3rd Digit                Absent          +----------+---------------+--------+-------+ 4th Digit                Absent          +----------+---------------+--------+-------+  5th Digit                Absent          +----------+---------------+--------+-------+  +---------+---------------+--------+------------------+ Left ToesPressure (mmHg)WaveformComment            +---------+---------------+--------+------------------+ 1st Digit               AbnormalModerate           +---------+---------------+--------+------------------+ 2nd Digit                AbnormalModerate to Severe +---------+---------------+--------+------------------+ 3rd Digit               AbnormalModerate           +---------+---------------+--------+------------------+ 4th Digit               AbnormalMild               +---------+---------------+--------+------------------+ 5th Digit               AbnormalMild               +---------+---------------+--------+------------------+   Summary: Right: Resting right ankle-brachial index indicates moderate right lower extremity arterial disease. The right toe-brachial index is abnormal. Left: Resting left ankle-brachial index indicates moderate left lower extremity arterial disease. The left toe-brachial index is abnormal. *See table(s) above for measurements and observations. See LE Arterial duplex report. Suggest Peripheral Vascular Consult. Scheduled to see Dr. Gwenlyn Found on 04/04/2022. Electronically signed by Kathlyn Sacramento MD on 03/31/2022 at 3:29:19 PM.    Final    DG Foot Complete Right  Result Date: 03/30/2022 CLINICAL DATA:  pain/swelling EXAM: RIGHT FOOT COMPLETE - 3+ VIEW COMPARISON:  None Available. FINDINGS: No evidence of acute fracture or joint malalignment. Mild for age bony degenerative change. Small plantar calcaneal enthesophytes. IMPRESSION: No evidence of acute bony abnormality. Electronically Signed   By: Margaretha Sheffield M.D.   On: 03/30/2022 13:10   DG Chest 2 View  Result Date: 03/13/2022 CLINICAL DATA:  Shortness of breath. Emesis for 5 days. Productive cough. EXAM: CHEST - 2 VIEW COMPARISON:  AP chest 07/11/2020 FINDINGS: Cardiac silhouette and mediastinal contours are within normal limits. The lungs are clear. No pleural effusion or pneumothorax. No acute skeletal abnormality. Hernia mesh coils overlie the anterior upper abdomen. IMPRESSION: No active cardiopulmonary disease. Electronically Signed   By: Yvonne Kendall M.D.   On: 03/13/2022 14:29   Disposition   Pt is being discharged home  today in good condition.  Follow-up Plans & Appointments     Follow-up Information     Lorretta Harp, MD Follow up on 04/26/2022.   Specialties: Cardiology, Radiology Why: Appointment at 1:30 Contact information: [address] [place] [address] [phone]                Discharge Instructions     Ambulatory referral to Pain Clinic   Complete by: As directed    Diet - low sodium heart healthy   Complete by: As directed    Discharge instructions   Complete by: As directed    Groin Site Care Refer to this sheet in the next few weeks. These instructions provide you with information on caring for yourself after your procedure. Your caregiver may also give you more specific instructions. Your treatment has been planned according to current medical practices, but problems sometimes occur. Call your caregiver if you have any problems or questions after your procedure. HOME CARE INSTRUCTIONS You may shower 24 hours after  the procedure. Remove the bandage (dressing) and gently wash the site with plain soap and water. Gently pat the site dry.  Do not apply powder or lotion to the site.  Do not sit in a bathtub, swimming pool, or whirlpool for 5 to 7 days.  No bending, squatting, or lifting anything over 10 pounds (4.5 kg) as directed by your caregiver.  Inspect the site at least twice daily.  Do not drive home if you are discharged the same day of the procedure. Have someone else drive you.  You may drive 24 hours after the procedure unless otherwise instructed by your caregiver.  What to expect: Any bruising will usually fade within 1 to 2 weeks.  Blood that collects in the tissue (hematoma) may be painful to the touch. It should usually decrease in size and tenderness within 1 to 2 weeks.  SEEK IMMEDIATE MEDICAL CARE IF: You have unusual pain at the groin site or down the affected leg.  You have redness, warmth, swelling, or pain at the groin site.  You  have drainage (other than a small amount of blood on the dressing).  You have chills.  You have a fever or persistent symptoms for more than 72 hours.  You have a fever and your symptoms suddenly get worse.  Your leg becomes pale, cool, tingly, or numb.  You have heavy bleeding from the site. Hold pressure on the site. .   Increase activity slowly   Complete by: As directed         Discharge Medications   Allergies as of 04/07/2022   No Known Allergies      Medication List     STOP taking these medications    lisinopril 10 MG tablet Commonly known as: ZESTRIL       TAKE these medications    amLODipine 5 MG tablet Commonly known as: NORVASC Take 1 tablet (5 mg total) by mouth daily.   aspirin EC 81 MG tablet Take 1 tablet (81 mg total) by mouth daily. Swallow whole. Start taking on: April 08, 2022   atorvastatin 80 MG tablet Commonly known as: LIPITOR Take 1 tablet (80 mg total) by mouth daily. Start taking on: April 08, 2022   clopidogrel 75 MG tablet Commonly known as: PLAVIX Take 1 tablet (75 mg total) by mouth daily with breakfast. Start taking on: April 08, 2022   docusate sodium 100 MG capsule Commonly known as: COLACE Take 100 mg by mouth daily.   ibuprofen 600 MG tablet Commonly known as: ADVIL Take 1 tablet (600 mg total) by mouth every 6 (six) hours as needed. What changed:  how much to take reasons to take this   nicotine 14 mg/24hr patch Commonly known as: NICODERM CQ - dosed in mg/24 hours Place 1 patch (14 mg total) onto the skin daily. Start taking on: April 08, 2022   nicotine polacrilex 4 MG gum Commonly known as: NICORETTE Take 4 mg by mouth as needed for smoking cessation.   ondansetron 4 MG disintegrating tablet Commonly known as: ZOFRAN-ODT Take 1 tablet (4 mg total) by mouth every 8 (eight) hours as needed for nausea or vomiting.           Outstanding Labs/Studies   LFTs and lipid panel in 2-3 months    Duration of Discharge Encounter   Greater than 30 minutes including physician time.  Signed, Margie Billet, PA-C 04/07/2022, 11:17 AM  I have personally seen and examined this patient. I agree with  the assessment and plan as outlined above. PV procedure yesterday per Dr. Gwenlyn Found. She is doing well this am. Left groin is stable. BP stable.  Will d/c home today.   Margie Billet, MD, [facility] 04/07/2022 11:17 AM  I have personally seen and examined this patient. I agree with the assessment and plan as outlined above.  She is doing well today. Groin is stable. Will d/c home today. See plan above.   Lauree Chandler, MD, [facility] 04/07/2022 11:35 AM

## 2022-04-11 ENCOUNTER — Encounter (HOSPITAL_COMMUNITY): Payer: Self-pay | Admitting: Cardiovascular Disease

## 2022-04-13 ENCOUNTER — Ambulatory Visit (HOSPITAL_COMMUNITY)
Admission: RE | Admit: 2022-04-13 | Discharge: 2022-04-13 | Disposition: A | Discharge status: Home / Self Care | Payer: No Typology Code available for payment source | Source: Ambulatory Visit | Attending: Cardiovascular Disease | Admitting: Cardiovascular Disease

## 2022-04-13 DIAGNOSIS — I70223 Atherosclerosis of native arteries of extremities with rest pain, bilateral legs: Secondary | ICD-10-CM | POA: Insufficient documentation

## 2022-04-13 DIAGNOSIS — Z720 Tobacco use: Secondary | ICD-10-CM | POA: Diagnosis not present

## 2022-04-13 DIAGNOSIS — I10 Essential (primary) hypertension: Secondary | ICD-10-CM | POA: Diagnosis not present

## 2022-04-26 ENCOUNTER — Encounter: Payer: Self-pay | Admitting: Cardiovascular Disease

## 2022-04-26 ENCOUNTER — Ambulatory Visit
Payer: No Typology Code available for payment source | Attending: Cardiovascular Disease | Admitting: Cardiovascular Disease

## 2022-04-26 VITALS — BP 122/58 | HR 51 | Ht 65.0 in | Wt 141.0 lb

## 2022-04-26 DIAGNOSIS — Z720 Tobacco use: Secondary | ICD-10-CM

## 2022-04-26 DIAGNOSIS — I10 Essential (primary) hypertension: Secondary | ICD-10-CM | POA: Diagnosis not present

## 2022-04-26 DIAGNOSIS — I70223 Atherosclerosis of native arteries of extremities with rest pain, bilateral legs: Secondary | ICD-10-CM | POA: Diagnosis not present

## 2022-04-26 DIAGNOSIS — E782 Mixed hyperlipidemia: Secondary | ICD-10-CM

## 2022-04-26 DIAGNOSIS — E785 Hyperlipidemia, unspecified: Secondary | ICD-10-CM | POA: Insufficient documentation

## 2022-04-26 NOTE — Patient Instructions (Signed)
Medication Instructions:  Your physician recommends that you continue on your current medications as directed. Please refer to the Current Medication list given to you today.  *If you need a refill on your cardiac medications before your next appointment, please call your pharmacy*   Lab Work: Your physician recommends that you return for lab work in: 3 months for FASTING lipid/liver panel  If you have labs (blood work) drawn today and your tests are completely normal, you will receive your results only by: [place] (if you have MyChart) OR A paper copy in the mail If you have any lab test that is abnormal or we need to change your treatment, we will call you to review the results.   Testing/Procedures: Your physician has requested that you have a lower extremity arterial duplex. During this test, ultrasound is used to evaluate arterial blood flow in the legs. Allow one hour for this exam. There are no restrictions or special instructions. This will take place at [place], [address]. To be done in July.  Your physician has requested that you have an ankle brachial index (ABI). During this test an ultrasound and blood pressure cuff are used to evaluate the arteries that supply the arms and legs with blood. Allow thirty minutes for this exam. There are no restrictions or special instructions. This will take place at [place], [address]. To be done in July     Follow-Up: At [facility], you and your health needs are our priority.  As part of our continuing mission to provide you with exceptional heart care, we have created designated Provider Care Teams.  These Care Teams include your primary Cardiologist (physician) and Advanced Practice Providers (APPs -  Physician Assistants and Nurse Practitioners) who all work together to provide you with the care you need, when you need it.  We recommend signing up for the patient portal called "MyChart".  Sign up  information is provided on this After Visit Summary.  MyChart is used to connect with patients for Virtual Visits (Telemedicine).  Patients are able to view lab/test results, encounter notes, upcoming appointments, etc.  Non-urgent messages can be sent to your provider as well.   To learn more about what you can do with MyChart, go to [email or web address].    Your next appointment:   6 month(s)  Provider:   Quay Burow, MD

## 2022-04-26 NOTE — Addendum Note (Signed)
Addended by: Beatrix Fetters on: 04/26/2022 01:52 PM   Modules accepted: Orders

## 2022-04-26 NOTE — Assessment & Plan Note (Signed)
Ongoing tobacco abuse with the intention to stop.  She is on a NicoDerm patch.

## 2022-04-26 NOTE — Progress Notes (Signed)
04/26/2022 Renelda Loma   [birth year]  [id number]  [place], [place] Primary Cardiologist: Lorretta Harp MD Lupe Carney, [place]  HPI:  Anne OLSHEFSKI is a 62 y.o.   thin-appearing divorced African-American female mother of 3 children who is retired from working at [employer].  She was referred by Dr. Boneta Lucks, podiatrist, for critical limb ischemia.  I last saw her in the office 04/04/2022.  Risk factors include treated hypertension and ongoing tobacco abuse having smoked 45 pack years and currently smoking one third of a pack a day.  There is no family history for heart disease.  She is never had an attack or stroke.  She denies chest pain or shortness of breath.  She developed pain in her right foot approxi-3 weeks ago and now has some acrocyanosis.  She was seen in the ER for this on 03/30/2022.  Recent Doppler studies performed 03/31/2022 revealed a right ABI of 0.73 and a left of 0.70.  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  I performed peripheral angiography on her 04/06/2022 revealing a 99% mid right SFA stenosis, occluded posterior tibial and anterior tibial I performed [facility] 1 directional atherectomy followed by [facility] of the mid right SFA PTA of anterior tibial.  Her right lower extremity discomfort resolved almost immediately.  Her right ABI increased from 0.73-0.98 and her velocities in her mid right SFA improved as well.  She was discharged home the following day on dual antiplatelet therapy.  She is trying to stop smoking.  She was also started on high-dose statin therapy during her hospitalization.   Current Meds  Medication Sig   amLODipine (NORVASC) 5 MG tablet Take 1 tablet (5 mg total) by mouth daily.   aspirin EC 81 MG tablet Take 1 tablet (81 mg total) by mouth daily. Swallow whole.   atorvastatin (LIPITOR) 80 MG tablet Take 1 tablet (80 mg  total) by mouth daily.   clopidogrel (PLAVIX) 75 MG tablet Take 1 tablet (75 mg total) by mouth daily with breakfast.   docusate sodium (COLACE) 100 MG capsule Take 100 mg by mouth daily.   hydrochlorothiazide (HYDRODIURIL) 25 MG tablet Take 25 mg by mouth daily.   ibuprofen (ADVIL) 600 MG tablet Take 1 tablet (600 mg total) by mouth every 6 (six) hours as needed. (Patient taking differently: Take 600-800 mg by mouth every 6 (six) hours as needed for mild pain or moderate pain.)   nicotine (NICODERM CQ - DOSED IN MG/24 HOURS) 14 mg/24hr patch Place 1 patch (14 mg total) onto the skin daily.   nicotine polacrilex (NICORETTE) 4 MG gum Take 4 mg by mouth as needed for smoking cessation.   ondansetron (ZOFRAN-ODT) 4 MG disintegrating tablet Take 1 tablet (4 mg total) by mouth every 8 (eight) hours as needed for nausea or vomiting.     No Known Allergies  Social History   Socioeconomic History   Marital status: Legally Separated    Spouse name: Not on file   Number of children: Not on file   Years of education: Not on file   Highest education level: Not on file  Occupational History   Not on file  Tobacco Use   Smoking status: Every Day    Packs/day: 1.00    Types: Cigarettes   Smokeless tobacco: Never  Vaping Use   Vaping Use:  Never used  Substance and Sexual Activity   Alcohol use: No   Drug use: Not Currently   Sexual activity: Not on file  Other Topics Concern   Not on file  Social History Narrative   Not on file   Social Determinants of Health   Financial Resource Strain: Not on file  Food Insecurity: Not on file  Transportation Needs: Not on file  Physical Activity: Not on file  Stress: Not on file  Social Connections: Not on file  Intimate Partner Violence: Not on file     Review of Systems: General: negative for chills, fever, night sweats or weight changes.  Cardiovascular: negative for chest pain, dyspnea on exertion, edema, orthopnea, palpitations, paroxysmal  nocturnal dyspnea or shortness of breath Dermatological: negative for rash Respiratory: negative for cough or wheezing Urologic: negative for hematuria Abdominal: negative for nausea, vomiting, diarrhea, bright red blood per rectum, melena, or hematemesis Neurologic: negative for visual changes, syncope, or dizziness All other systems reviewed and are otherwise negative except as noted above.    Blood pressure (!) 122/58, pulse (!) 51, height 5\' 5"  (1.651 m), weight 141 lb (64 kg), SpO2 95 %.  General appearance: alert and no distress Neck: no adenopathy, no carotid bruit, no JVD, supple, symmetrical, trachea midline, and thyroid not enlarged, symmetric, no tenderness/mass/nodules Lungs: clear to auscultation bilaterally Heart: regular rate and rhythm, S1, S2 normal, no murmur, click, rub or gallop Extremities: extremities normal, atraumatic, no cyanosis or edema Pulses: 2+ and symmetric Skin: Skin color, texture, turgor normal. 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  Performed [facility] 1 directional atherectomy followed by [facility].  She also had an occluded anterior tibial which I angioplastied as well.  Her pain is completely resolved.  Her Doppler studies revealed an increase in her right ABI from 0.73 up to 0.98 and the velocities have improved.  She is on aspirin and clopidogrel.  Will recheck a lower extremity show Doppler study in 6 months.  Essential hypertension History of essential hypertension blood pressure measured today 122/58.  She is on amlodipine, and hydrochlorothiazide.  Tobacco abuse Ongoing tobacco abuse with the intention to stop.  She is on a NicoDerm patch.  Hyperlipidemia History of heart for lipidemia previously not on statin  therapy with lipid profile performed 04/07/2022 revealing total cholesterol 89, LDL 128 and HDL 51.  She was subsequently week on high-dose atorvastatin.  We will recheck a lipid liver profile in 3 months.     Lorretta Harp MD FACP,FACC,FAHA, [facility] 04/26/2022 1:40 PM

## 2022-04-26 NOTE — Assessment & Plan Note (Signed)
History of heart for lipidemia previously not on statin therapy with lipid profile performed 04/07/2022 revealing total cholesterol 89, LDL 128 and HDL 51.  She was subsequently week on high-dose atorvastatin.  We will recheck a lipid liver profile in 3 months.

## 2022-04-26 NOTE — Assessment & Plan Note (Signed)
History of critical ischemia status post peripheral angiography by myself 04/04/2022 revealing a 99% mid right SFA stenosis.  Performed [facility] 1 directional atherectomy followed by [facility].  She also had an occluded anterior tibial which I angioplastied as well.  Her pain is completely resolved.  Her Doppler studies revealed an increase in her right ABI from 0.73 up to 0.98 and the velocities have improved.  She is on aspirin and clopidogrel.  Will recheck a lower extremity show Doppler study in 6 months.

## 2022-04-26 NOTE — Assessment & Plan Note (Signed)
History of essential hypertension blood pressure measured today 122/58.  She is on amlodipine, and hydrochlorothiazide.

## 2022-05-04 ENCOUNTER — Telehealth: Payer: Self-pay | Admitting: Cardiovascular Disease

## 2022-05-04 ENCOUNTER — Other Ambulatory Visit (HOSPITAL_COMMUNITY): Payer: Self-pay

## 2022-05-04 NOTE — Telephone Encounter (Signed)
*  STAT* If patient is at the pharmacy, call can be transferred to refill team.   1. Which medications need to be refilled? (please list name of each medication and dose if known) amLODipine (NORVASC) 5 MG tablet   atorvastatin (LIPITOR) 80 MG tablet   clopidogrel (PLAVIX) 75 MG tablet   2. Which pharmacy/location (including street and city if local pharmacy) is medication to be sent to?  [id number] - [place], [place] - [address]    3. Do they need a 30 day or 90 day supply? [place]

## 2022-05-05 MED ORDER — CLOPIDOGREL BISULFATE 75 MG PO TABS: 75.0000 mg | ORAL_TABLET | Freq: Every day | ORAL | 1 refills | Status: DC

## 2022-05-05 MED ORDER — AMLODIPINE BESYLATE 5 MG PO TABS: 5.0000 mg | ORAL_TABLET | Freq: Every day | ORAL | 1 refills | Status: DC

## 2022-05-05 MED ORDER — ATORVASTATIN CALCIUM 80 MG PO TABS: 80.0000 mg | ORAL_TABLET | Freq: Every day | ORAL | 1 refills | Status: DC

## 2022-05-16 ENCOUNTER — Telehealth: Payer: Self-pay | Admitting: Cardiovascular Disease

## 2022-05-16 NOTE — Telephone Encounter (Signed)
*  STAT* If patient is at the pharmacy, call can be transferred to refill team.   1. Which medications need to be refilled? (please list name of each medication and dose if known)   amLODipine (NORVASC) 5 MG tablet   clopidogrel (PLAVIX) 75 MG tablet   atorvastatin (LIPITOR) 80 MG tablet    2. Which pharmacy/location (including street and city if local pharmacy) is medication to be sent to? [place], phone: [phone]  3. Do they need a 30 day or 90 day supply? 90 day  Patient states she has 7 days left.

## 2022-05-17 ENCOUNTER — Other Ambulatory Visit: Payer: Self-pay

## 2022-05-17 MED ORDER — AMLODIPINE BESYLATE 5 MG PO TABS: 5.0000 mg | ORAL_TABLET | Freq: Every day | ORAL | 2 refills | Status: DC

## 2022-05-17 MED ORDER — CLOPIDOGREL BISULFATE 75 MG PO TABS: 75.0000 mg | ORAL_TABLET | Freq: Every day | ORAL | 2 refills | Status: DC

## 2022-05-17 MED ORDER — ATORVASTATIN CALCIUM 80 MG PO TABS: 80.0000 mg | ORAL_TABLET | Freq: Every day | ORAL | 2 refills | Status: DC

## 2022-07-21 ENCOUNTER — Telehealth: Payer: Self-pay

## 2022-07-21 NOTE — Telephone Encounter (Signed)
Dr. Allyson Sabal,  You saw this patient on 04/26/2022. Will you please comment on medical clearance for colonoscopy? You did a PV intervention in December 2023. Can she hold her Plavix for procedure? They are not requesting an aspirin hold.   Please route your response to P [facility] Preop. I will communicate with requesting office once you have given recommendations.   Thank you!  Anne Levering, NP

## 2022-07-21 NOTE — Telephone Encounter (Signed)
   Pre-operative Risk Assessment    Patient Name: [name]  DOB: 09/29/1960 MRN: [id number]      Request for Surgical Clearance    Procedure:   COLONOSCOPY   Date of Surgery:  Clearance 08/01/22                                 Surgeon:  Brent General INDICATED  Surgeon's Group or Practice Name:  [facility]  Phone number:  [phone] Fax number:  [phone]   Type of Clearance Requested:   - Pharmacy:  Hold Clopidogrel (Plavix) 5-7 DAYS?   Type of Anesthesia:  Not Indicated   Additional requests/questions:    SignedMichaelle Copas   07/21/2022, 4:33 PM

## 2022-07-24 NOTE — Telephone Encounter (Signed)
   Patient Name: JULIAETTE AGATE  DOB: 08/14/1960 MRN: [id number]  Primary Cardiologist: Nanetta Batty, MD  Chart reviewed as part of pre-operative protocol coverage. Pre-op clearance already addressed by colleagues in earlier phone notes. To summarize recommendations:  -Okay to hold Plavix for colonoscopy.  -Dr. Allyson Sabal  Will route this bundled recommendation to requesting provider via Epic fax function and remove from pre-op pool. Please call with questions.  Sharlene Dory, PA-C 07/24/2022, 7:17 AM

## 2022-08-07 ENCOUNTER — Other Ambulatory Visit (HOSPITAL_COMMUNITY): Payer: Self-pay

## 2022-09-29 ENCOUNTER — Telehealth: Payer: Self-pay | Admitting: Cardiovascular Disease

## 2022-09-29 DIAGNOSIS — Z79899 Other long term (current) drug therapy: Secondary | ICD-10-CM

## 2022-09-29 DIAGNOSIS — I999 Unspecified disorder of circulatory system: Secondary | ICD-10-CM

## 2022-09-29 MED ORDER — CLOPIDOGREL BISULFATE 75 MG PO TABS: 75.0000 mg | ORAL_TABLET | Freq: Every day | ORAL | 3 refills | Status: DC

## 2022-09-29 NOTE — Telephone Encounter (Signed)
Spoke to the pt, she will like for the office to send her prescription of Plavix to the [facility], in [place]. Sent prescription to the [facility] pharmacy as requested by the patient.

## 2022-09-29 NOTE — Telephone Encounter (Signed)
Pt c/o medication issue:  1. Name of Medication:   clopidogrel (PLAVIX) 75 MG tablet    2. How are you currently taking this medication (dosage and times per day)? As written  3. Are you having a reaction (difficulty breathing--STAT)? No   4. What is your medication issue? Pt called in stating she needs to know if she needs to keep taking this medication. She states the [place] told her to ask. Please advise.

## 2022-11-07 ENCOUNTER — Ambulatory Visit
Payer: No Typology Code available for payment source | Attending: Cardiovascular Disease | Admitting: Cardiovascular Disease

## 2022-11-10 ENCOUNTER — Ambulatory Visit (HOSPITAL_COMMUNITY): Admission: RE | Admit: 2022-11-10 | Payer: No Typology Code available for payment source | Source: Ambulatory Visit

## 2023-01-23 ENCOUNTER — Ambulatory Visit (HOSPITAL_COMMUNITY)
Admission: RE | Admit: 2023-01-23 | Discharge: 2023-01-23 | Disposition: A | Discharge status: Home / Self Care | Payer: No Typology Code available for payment source | Source: Ambulatory Visit | Attending: Cardiology | Admitting: Cardiology

## 2023-01-23 ENCOUNTER — Ambulatory Visit (HOSPITAL_BASED_OUTPATIENT_CLINIC_OR_DEPARTMENT_OTHER)
Admission: RE | Admit: 2023-01-23 | Discharge: 2023-01-23 | Disposition: A | Discharge status: Home / Self Care | Payer: No Typology Code available for payment source | Source: Ambulatory Visit | Attending: Cardiovascular Disease | Admitting: Cardiovascular Disease

## 2023-01-23 DIAGNOSIS — I10 Essential (primary) hypertension: Secondary | ICD-10-CM

## 2023-01-23 DIAGNOSIS — E782 Mixed hyperlipidemia: Secondary | ICD-10-CM

## 2023-01-23 DIAGNOSIS — I70223 Atherosclerosis of native arteries of extremities with rest pain, bilateral legs: Secondary | ICD-10-CM | POA: Insufficient documentation

## 2023-01-23 DIAGNOSIS — Z720 Tobacco use: Secondary | ICD-10-CM | POA: Diagnosis present

## 2023-01-24 LAB — VAS US ABI WITH/WO TBI
Left ABI: 0.7
Right ABI: 1.07

## 2023-02-21 ENCOUNTER — Ambulatory Visit
Payer: No Typology Code available for payment source | Attending: Cardiovascular Disease | Admitting: Cardiovascular Disease

## 2023-04-25 ENCOUNTER — Ambulatory Visit
Payer: No Typology Code available for payment source | Attending: Cardiovascular Disease | Admitting: Cardiovascular Disease

## 2023-05-04 ENCOUNTER — Encounter: Payer: Self-pay | Admitting: Cardiovascular Disease

## 2023-06-27 ENCOUNTER — Ambulatory Visit
Payer: No Typology Code available for payment source | Attending: Cardiovascular Disease | Admitting: Cardiovascular Disease

## 2023-06-28 ENCOUNTER — Encounter: Payer: Self-pay | Admitting: Cardiovascular Disease

## 2023-07-09 ENCOUNTER — Ambulatory Visit: Attending: Cardiovascular Disease | Admitting: Cardiovascular Disease

## 2023-07-09 ENCOUNTER — Encounter: Payer: Self-pay | Admitting: Cardiovascular Disease

## 2023-07-09 VITALS — BP 120/66 | HR 43 | Ht 65.0 in | Wt 165.0 lb

## 2023-07-09 DIAGNOSIS — E782 Mixed hyperlipidemia: Secondary | ICD-10-CM

## 2023-07-09 DIAGNOSIS — I739 Peripheral vascular disease, unspecified: Secondary | ICD-10-CM

## 2023-07-09 DIAGNOSIS — Z79899 Other long term (current) drug therapy: Secondary | ICD-10-CM

## 2023-07-09 DIAGNOSIS — I999 Unspecified disorder of circulatory system: Secondary | ICD-10-CM

## 2023-07-09 DIAGNOSIS — Z720 Tobacco use: Secondary | ICD-10-CM

## 2023-07-09 DIAGNOSIS — I10 Essential (primary) hypertension: Secondary | ICD-10-CM

## 2023-07-09 DIAGNOSIS — I70223 Atherosclerosis of native arteries of extremities with rest pain, bilateral legs: Secondary | ICD-10-CM

## 2023-07-09 DIAGNOSIS — R001 Bradycardia, unspecified: Secondary | ICD-10-CM | POA: Insufficient documentation

## 2023-07-09 LAB — LIPID PANEL
Chol/HDL Ratio: 2.7 ratio (ref 0.0–4.4)
Cholesterol, Total: 153 mg/dL (ref 100–199)
HDL: 56 mg/dL (ref >39)
LDL Chol Calc (NIH): 86 mg/dL (ref 0–99)
Triglycerides: 53 mg/dL (ref 0–149)
VLDL Cholesterol Cal: 11 mg/dL (ref 5–40)

## 2023-07-09 LAB — HEPATIC FUNCTION PANEL
ALT: 15 IU/L (ref 0–32)
AST: 21 IU/L (ref 0–40)
Albumin: 4.4 g/dL (ref 3.9–4.9)
Alkaline Phosphatase: 114 IU/L (ref 44–121)
Bilirubin Total: 0.2 mg/dL (ref 0.0–1.2)
Bilirubin, Direct: 0.11 mg/dL (ref 0.00–0.40)
Total Protein: 6.9 g/dL (ref 6.0–8.5)

## 2023-07-09 MED ORDER — ATORVASTATIN CALCIUM 80 MG PO TABS: 80.0000 mg | ORAL_TABLET | Freq: Every day | ORAL | 3 refills | Status: AC

## 2023-07-09 MED ORDER — ASPIRIN 81 MG PO TBEC: 81.0000 mg | DELAYED_RELEASE_TABLET | Freq: Every day | ORAL | 3 refills | Status: AC

## 2023-07-09 MED ORDER — AMLODIPINE BESYLATE 5 MG PO TABS: 5.0000 mg | ORAL_TABLET | Freq: Every day | ORAL | 3 refills | Status: AC

## 2023-07-09 MED ORDER — CLOPIDOGREL BISULFATE 75 MG PO TABS: 75.0000 mg | ORAL_TABLET | Freq: Every day | ORAL | 3 refills | Status: AC

## 2023-07-09 NOTE — Assessment & Plan Note (Signed)
 Markedly reduced tobacco abuse of 1 pack/week with intent to stop by her next office visit.

## 2023-07-09 NOTE — Assessment & Plan Note (Signed)
 History of essential hypertension with blood pressure measured today at 120/66.  She is on amlodipine, and hydrochlorothiazide.

## 2023-07-09 NOTE — Assessment & Plan Note (Addendum)
 History of critical limb ischemia status post peripheral angiography which I performed 04/06/2022 revealing a 99% mid right SFA stenosis and occluded anterior and posterior tibial arteries.  I performed [facility] directional atherectomy followed by [facility] of the right SFA and the AT.  Her ABIs normalized and her rest pain and discoloration resolved.  Her most recent Doppler studies performed 01/23/2023 revealed normal ABIs on the right with a moderately elevated high-frequency signal in the mid right SFA.  The AT appears patent.  She denies rest pain, or claudication.  Will continue to follow this on annual basis.

## 2023-07-09 NOTE — Addendum Note (Signed)
 Addended by: Bernita Buffy on: 07/09/2023 09:06 AM   Modules accepted: Orders

## 2023-07-09 NOTE — Patient Instructions (Signed)
 Medication Instructions:  Your physician recommends that you continue on your current medications as directed. Please refer to the Current Medication list given to you today.  *If you need a refill on your cardiac medications before your next appointment, please call your pharmacy*  Lab Work: Your physician recommends that you have labs drawn today: Lipid/liver panel  If you have labs (blood work) drawn today and your tests are completely normal, you will receive your results only by: MyChart Message (if you have MyChart) OR A paper copy in the mail If you have any lab test that is abnormal or we need to change your treatment, we will call you to review the results.  Testing/Procedures: Your physician has requested that you have a lower extremity arterial duplex. During this test, ultrasound is used to evaluate arterial blood flow in the legs. Allow one hour for this exam. There are no restrictions or special instructions. **To do in October**   Please note: We ask at that you not bring children with you during ultrasound (echo/ vascular) testing. Due to room size and safety concerns, children are not allowed in the ultrasound rooms during exams. Our front office staff cannot provide observation of children in our lobby area while testing is being conducted. An adult accompanying a patient to their appointment will only be allowed in the ultrasound room at the discretion of the ultrasound technician under special circumstances. We apologize for any inconvenience.  Your physician has requested that you have an ankle brachial index (ABI). During this test an ultrasound and blood pressure cuff are used to evaluate the arteries that supply the arms and legs with blood. Allow thirty minutes for this exam. There are no restrictions or special instructions.  **To do in October**   Please note: We ask at that you not bring children with you during ultrasound (echo/ vascular) testing. Due to room size  and safety concerns, children are not allowed in the ultrasound rooms during exams. Our front office staff cannot provide observation of children in our lobby area while testing is being conducted. An adult accompanying a patient to their appointment will only be allowed in the ultrasound room at the discretion of the ultrasound technician under special circumstances. We apologize for any inconvenience.   Follow-Up: At [facility], you and your health needs are our priority.  As part of our continuing mission to provide you with exceptional heart care, our providers are all part of one team.  This team includes your primary Cardiologist (physician) and Advanced Practice Providers or APPs (Physician Assistants and Nurse Practitioners) who all work together to provide you with the care you need, when you need it.  Your next appointment:   12 month(s)  Provider:   Nanetta Batty, MD     We recommend signing up for the patient portal called "MyChart".  Sign up information is provided on this After Visit Summary.  MyChart is used to connect with patients for Virtual Visits (Telemedicine).  Patients are able to view lab/test results, encounter notes, upcoming appointments, etc.  Non-urgent messages can be sent to your provider as well.   To learn more about what you can do with MyChart, go to [email or web address].   Other Instructions       1st Floor: - Lobby - Registration  - Pharmacy  - Lab - Cafe  2nd Floor: - PV Lab - Diagnostic Testing (echo, CT, nuclear med)  3rd Floor: - Vacant  4th Floor: - TCTS (cardiothoracic surgery) -  AFib Clinic - Structural Heart Clinic - Vascular Surgery  - Vascular Ultrasound  5th Floor: - [facility] (general and EP) - Clinical Pharmacy for coumadin, hypertension, lipid, weight-loss medications, and med management appointments    Valet parking services will be available as well.

## 2023-07-09 NOTE — Progress Notes (Signed)
 07/09/2023 OZA OBERLE   [birth year]  [id number]  Primary Physician Center, [facility] Primary Cardiologist: Runell Gess MD [name], [place]  HPI:  [name] is a 63 y.o.    thin-appearing divorced African-American female mother of 3 children who is retired from working at [employer].  She was referred by Dr. Nicholes Rough, podiatrist, for critical limb ischemia.  I last saw her in the office 04/26/2022.  Risk factors include treated hypertension and ongoing tobacco abuse having smoked 45 pack years and currently smoking 1 pack/week with the intent to stop..  There is no family history for heart disease.  She is never had an attack or stroke.  She denies chest pain or shortness of breath.  She developed pain in her right foot approxi-3 weeks ago and now has some acrocyanosis.  She was seen in the ER for this on 03/30/2022.  Recent Doppler studies performed 03/31/2022 revealed a right ABI of 0.73 and a left of 0.70.  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 1 directional atherectomy followed by [facility] of the mid right SFA PTA of anterior tibial.  Her right lower extremity discomfort resolved almost immediately.  Her right ABI increased from 0.73-0.98 and her velocities in her mid right SFA improved as well.  She was discharged home the following day on dual antiplatelet therapy.  She is trying to stop smoking.  She was also started on high-dose statin therapy during her hospitalization.  Since I saw her a year ago she has markedly reduced her smoking to 1 pack/week.  Her Dopplers performed.  01/23/2023 revealed normal right ABI with a moderately elevated signal in the mid right SFA although she denies rest pain, claudication or  discoloration.   Current Meds  Medication Sig   amLODipine (NORVASC) 5 MG tablet Take 1 tablet (5 mg total) by mouth daily.   aspirin EC 81 MG tablet Take 1 tablet (81 mg total) by mouth daily. Swallow whole.   atorvastatin (LIPITOR) 80 MG tablet Take 1 tablet (80 mg total) by mouth daily.   clopidogrel (PLAVIX) 75 MG tablet Take 1 tablet (75 mg total) by mouth daily.   docusate sodium (COLACE) 100 MG capsule Take 100 mg by mouth daily.   hydrochlorothiazide (HYDRODIURIL) 25 MG tablet Take 25 mg by mouth daily.   ibuprofen (ADVIL) 600 MG tablet Take 1 tablet (600 mg total) by mouth every 6 (six) hours as needed. (Patient taking differently: Take 600-800 mg by mouth every 6 (six) hours as needed for mild pain (pain score 1-3) or moderate pain (pain score 4-6).)   nicotine (NICODERM CQ - DOSED IN MG/24 HOURS) 14 mg/24hr patch Place 1 patch (14 mg total) onto the skin daily.   nicotine polacrilex (NICORETTE) 4 MG gum Take 4 mg by mouth as needed for smoking cessation.   ondansetron (ZOFRAN-ODT) 4 MG disintegrating tablet Take 1 tablet (4 mg total) by mouth every 8 (eight) hours as needed for nausea or vomiting.     No Known Allergies  Social History   Socioeconomic History   Marital status: Legally Separated    Spouse name: Not on file   Number of children: Not on file   Years of  education: Not on file   Highest education level: Not on file  Occupational History   Not on file  Tobacco Use   Smoking status: Every Day    Current packs/day: 1.00    Types: Cigarettes   Smokeless tobacco: Never  Vaping Use   Vaping status: Never Used  Substance and Sexual Activity   Alcohol use: No   Drug use: Not Currently   Sexual activity: Not on file  Other Topics Concern   Not on file  Social History Narrative   Not on file   Social Drivers of Health   Financial Resource Strain: Not on file  Food Insecurity: Not on file  Transportation Needs: Not on file  Physical Activity: Not on file   Stress: Not on file  Social Connections: Not on file  Intimate Partner Violence: Not on file     Review of Systems: General: negative for chills, fever, night sweats or weight changes.  Cardiovascular: negative for chest pain, dyspnea on exertion, edema, orthopnea, palpitations, paroxysmal nocturnal dyspnea or shortness of breath Dermatological: negative for rash Respiratory: negative for cough or wheezing Urologic: negative for hematuria Abdominal: negative for nausea, vomiting, diarrhea, bright red blood per rectum, melena, or hematemesis Neurologic: negative for visual changes, syncope, or dizziness All other systems reviewed and are otherwise negative except as noted above.    Blood pressure 120/66, pulse (!) 43, height 5\' 5"  (1.651 m), weight 165 lb (74.8 kg), SpO2 98%.  General appearance: alert and no distress Neck: no adenopathy, no carotid bruit, no JVD, supple, symmetrical, trachea midline, and thyroid not enlarged, symmetric, no tenderness/mass/nodules Lungs: clear to auscultation bilaterally Heart: regular rate and rhythm, S1, S2 normal, no murmur, click, rub or gallop Extremities: extremities normal, atraumatic, no cyanosis or edema Pulses: Decreased left pedal pulse Skin: Skin color, texture, turgor normal. No rashes or lesions Neurologic: Grossly normal  EKG EKG Interpretation Date/Time:  Monday July 09 2023 08:25:45 EDT Ventricular Rate:  43 PR Interval:  152 QRS Duration:  96 QT Interval:  488 QTC Calculation: 412 R Axis:   -28  Text Interpretation: Marked sinus bradycardia with Premature atrial complexes Incomplete right bundle branch block When compared with ECG of 13-Mar-2022 14:11, Vent. rate has decreased BY  31 BPM QRS axis Shifted left ST no longer depressed in Anterior leads Nonspecific T wave abnormality now evident in Inferior leads Confirmed by Nanetta Batty [phone]) on 07/09/2023 8:31:35 AM    ASSESSMENT AND PLAN:   Essential  hypertension History of essential hypertension with blood pressure measured today at 120/66.  She is on amlodipine, and hydrochlorothiazide.  Tobacco abuse Markedly reduced tobacco abuse of 1 pack/week with intent to stop by her next office visit.  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  I performed [facility] directional atherectomy followed by [facility] of the right SFA and the AT.  Her ABIs normalized and her rest pain and discoloration resolved.  Her most recent Doppler studies performed 01/23/2023 revealed normal ABIs on the right with a moderately elevated high-frequency signal in the mid right SFA.  The AT appears patent.  She denies rest pain, or claudication.  Will continue to follow this on annual basis.  Hyperlipidemia History of hyperlipidemia on statin therapy.  We will recheck a fasting lipid liver profile this morning.  Sinus bradycardia Heart rate 43 today.  She is not on any negative chronotropic's.  She is asymptomatic.     Runell Gess MD FACP,FACC,FAHA, [facility] 07/09/2023 8:41 AM

## 2023-07-09 NOTE — Assessment & Plan Note (Signed)
 Heart rate 43 today.  She is not on any negative chronotropic's.  She is asymptomatic.

## 2023-07-09 NOTE — Assessment & Plan Note (Signed)
History of hyperlipidemia on statin therapy.  We will recheck a fasting lipid liver profile this morning.

## 2023-07-12 ENCOUNTER — Other Ambulatory Visit: Payer: Self-pay

## 2023-07-12 DIAGNOSIS — E782 Mixed hyperlipidemia: Secondary | ICD-10-CM

## 2023-07-12 MED ORDER — EZETIMIBE 10 MG PO TABS: 10.0000 mg | ORAL_TABLET | Freq: Every day | ORAL | 3 refills | Status: AC

## 2024-01-29 ENCOUNTER — Encounter (HOSPITAL_COMMUNITY)

## 2024-02-27 ENCOUNTER — Ambulatory Visit (HOSPITAL_COMMUNITY)
Admission: RE | Admit: 2024-02-27 | Discharge: 2024-02-27 | Disposition: A | Source: Ambulatory Visit | Attending: Cardiovascular Disease | Admitting: Cardiovascular Disease

## 2024-02-27 ENCOUNTER — Ambulatory Visit (HOSPITAL_COMMUNITY)
Admission: RE | Admit: 2024-02-27 | Discharge: 2024-02-27 | Disposition: A | Discharge status: Home / Self Care | Source: Ambulatory Visit | Attending: Cardiovascular Disease | Admitting: Cardiovascular Disease

## 2024-02-27 ENCOUNTER — Ambulatory Visit: Payer: Self-pay | Admitting: Cardiovascular Disease

## 2024-02-27 DIAGNOSIS — I739 Peripheral vascular disease, unspecified: Secondary | ICD-10-CM | POA: Insufficient documentation

## 2024-02-27 LAB — VAS US ABI WITH/WO TBI
Left ABI: 0.62
Right ABI: 0.82
